# Patient Record
Sex: Female | Born: 1990 | Race: White | Hispanic: No | Marital: Single | Smoking: Current every day smoker
Health system: Southern US, Community
[De-identification: ages and names within clinical notes are randomized; demographics above are authoritative.]

## PROBLEM LIST (undated history)

## (undated) DIAGNOSIS — F909 Attention-deficit hyperactivity disorder, unspecified type: Secondary | ICD-10-CM

## (undated) DIAGNOSIS — F32A Depression, unspecified: Secondary | ICD-10-CM

## (undated) DIAGNOSIS — F329 Major depressive disorder, single episode, unspecified: Secondary | ICD-10-CM

## (undated) DIAGNOSIS — F419 Anxiety disorder, unspecified: Secondary | ICD-10-CM

## (undated) DIAGNOSIS — F410 Panic disorder [episodic paroxysmal anxiety] without agoraphobia: Secondary | ICD-10-CM

## (undated) DIAGNOSIS — G43909 Migraine, unspecified, not intractable, without status migrainosus: Secondary | ICD-10-CM

## (undated) HISTORY — DX: Depression, unspecified: F32.A

## (undated) HISTORY — PX: OTHER SURGICAL HISTORY: SHX169

## (undated) HISTORY — PX: KNEE ARTHROSCOPY: SHX127

## (undated) HISTORY — DX: Major depressive disorder, single episode, unspecified: F32.9

---
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
## 2010-01-12 ENCOUNTER — Ambulatory Visit (HOSPITAL_COMMUNITY): Payer: Self-pay | Admitting: Psychiatry

## 2010-02-18 ENCOUNTER — Ambulatory Visit: Payer: Self-pay | Admitting: Internal Medicine

## 2010-02-18 DIAGNOSIS — R5381 Other malaise: Secondary | ICD-10-CM

## 2010-02-18 DIAGNOSIS — J45990 Exercise induced bronchospasm: Secondary | ICD-10-CM

## 2010-02-18 DIAGNOSIS — R259 Unspecified abnormal involuntary movements: Secondary | ICD-10-CM | POA: Insufficient documentation

## 2010-02-18 DIAGNOSIS — R5383 Other fatigue: Secondary | ICD-10-CM

## 2010-02-18 LAB — CONVERTED CEMR LAB: Free T4: 1.33 ng/dL (ref 0.80–1.80)

## 2010-02-19 ENCOUNTER — Telehealth: Payer: Self-pay | Admitting: Internal Medicine

## 2010-05-22 ENCOUNTER — Ambulatory Visit: Payer: Self-pay | Admitting: Internal Medicine

## 2010-05-25 ENCOUNTER — Ambulatory Visit: Payer: Self-pay | Admitting: Internal Medicine

## 2010-06-09 ENCOUNTER — Ambulatory Visit: Payer: Self-pay | Admitting: Internal Medicine

## 2010-06-09 DIAGNOSIS — J309 Allergic rhinitis, unspecified: Secondary | ICD-10-CM | POA: Insufficient documentation

## 2010-10-27 ENCOUNTER — Ambulatory Visit: Payer: Self-pay | Admitting: Internal Medicine

## 2010-10-27 DIAGNOSIS — H9190 Unspecified hearing loss, unspecified ear: Secondary | ICD-10-CM | POA: Insufficient documentation

## 2010-10-27 DIAGNOSIS — G4700 Insomnia, unspecified: Secondary | ICD-10-CM

## 2010-12-09 ENCOUNTER — Ambulatory Visit: Payer: Self-pay | Admitting: Pulmonary Disease

## 2010-12-09 DIAGNOSIS — G2581 Restless legs syndrome: Secondary | ICD-10-CM | POA: Insufficient documentation

## 2010-12-09 DIAGNOSIS — G4721 Circadian rhythm sleep disorder, delayed sleep phase type: Secondary | ICD-10-CM

## 2011-01-15 ENCOUNTER — Ambulatory Visit (INDEPENDENT_AMBULATORY_CARE_PROVIDER_SITE_OTHER)
Admission: RE | Admit: 2011-01-15 | Discharge: 2011-01-15 | Payer: Managed Care, Other (non HMO) | Source: Home / Self Care | Attending: Internal Medicine | Admitting: Internal Medicine

## 2011-01-15 ENCOUNTER — Telehealth (INDEPENDENT_AMBULATORY_CARE_PROVIDER_SITE_OTHER): Payer: Self-pay | Admitting: *Deleted

## 2011-01-15 DIAGNOSIS — G4700 Insomnia, unspecified: Secondary | ICD-10-CM

## 2011-01-19 NOTE — Assessment & Plan Note (Signed)
Summary: poss sinus infection/dt   Vital Signs:  Patient profile:   20 year old female Weight:      116.75 pounds BMI:     25.36 O2 Sat:      100 % on Room air Temp:     98.1 degrees F oral Pulse rate:   76 / minute Pulse rhythm:   regular Resp:     16 per minute BP sitting:   102 / 70  (right arm) Cuff size:   regular  Vitals Entered By: Glendell Docker CMA (June 09, 2010 10:01 AM)  O2 Flow:  Room air CC: Rm 2- Sinus Problems Is Patient Diabetic? No   Primary Care Provider:  Dondra Spry DO  CC:  Rm 2- Sinus Problems.  History of Present Illness:  20 y/o white female c/o nasal congestion,  clear drainage and headaches.  no fever or chills symptoms ongoing x 1 week.    no relief with OTC cold preps   Allergies: No Known Drug Allergies  Past History:  Past Medical History: Depression- Currently in treatment with Dr  Lucianne Muss @ [facility]    (hospital admission-2009)  Exercise-induced asthma     Past Surgical History: Left Knee Arthroscopic 2008  History of lambdoid stenosis requiring surgery at 57 months of age  (27)     Family History: Family History of Alcoholism/Addiction  Family History High cholesterol - maternal grandmother Family History Hypertension- maternal grandmother       Social History: Occupation: working at [employer] Single       Physical Exam  General:  alert, well-developed, and well-nourished.   Ears:  R ear normal and L ear normal.   Nose:  nasal dischargemucosal pallor and mucosal edema.   Lungs:  normal respiratory effort and normal breath sounds.   Heart:  normal rate, regular rhythm, and no gallop.     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 2 sprays each nostril once daily  Discussed use of allergy medications and  environmental measures.  Patient advised to call office if symptoms persist or worsen.  Complete Medication List: 1)  Seasonique 0.15-0.03 &0.01 Mg Tabs (Levonorgest-eth estrad 91-day) .... Take 1 tablet by mouth once a day 2)  Xopenex Hfa 45 Mcg/act Aero (Levalbuterol tartrate) .... 2 puffs qid as needed 3)  Amitriptyline Hcl 10 Mg Tabs (Amitriptyline hcl) .... One by mouth at bedtime prn 4)  Prednisone 20 Mg Tabs (Prednisone) .... One by mouth two times a day x 3 days, 1/2 by mouth two times a day x 4 days, than 1/2 once daily x 4 days 5)  Fluticasone Propionate 50 Mcg/act Susp (Fluticasone propionate) .... 2 sprays each nostril once daily  Patient Instructions: 1)  Call our office if your symptoms do not  improve or gets worse. Prescriptions: FLUTICASONE PROPIONATE 50 MCG/ACT SUSP (FLUTICASONE PROPIONATE) 2 sprays each nostril once daily  #1 x 2   Entered and Authorized by:   D. Thomos Lemons DO   Signed by:   D. Thomos Lemons DO on 06/09/2010   Method used:   Electronically to        [id number] (retail)       [address] [place], [place]  [address]       Ph: [phone]       Fax: [phone]   RxID:   [id number] PREDNISONE 20 MG TABS (PREDNISONE) one by mouth two times a day x 3 days, 1/2 by mouth two times a day x 4 days, than 1/2 once daily x 4 days  #10 x 0   Entered and Authorized by:   D. Thomos Lemons DO   Signed by:   D. Thomos Lemons DO on 06/09/2010   Method used:   Electronically to        [id number] (retail)       [address] [place], [place]  [address]       Ph: [phone]       Fax: [phone]   RxID:   [id number]

## 2011-01-19 NOTE — Progress Notes (Signed)
  Phone Note Outgoing Call   Summary of Call: call pt - thyroid studies normal.  mono spot is negative Initial call taken by: D. Thomos Lemons DO,  February 19, 2010 5:52 PM  Follow-up for Phone Call        Called pt. and informed patient that Thyroid studies are normal and mono spot is negative Follow-up by: Michaelle Copas,  February 20, 2010 8:46 AM

## 2011-01-19 NOTE — Assessment & Plan Note (Signed)
Summary: insomnia/dt   Vital Signs:  Patient profile:   20 year old female Height:      57 inches Weight:      114.25 pounds BMI:     24.81 O2 Sat:      98 % on Room air Temp:     97.9 degrees F oral Pulse rate:   72 / minute Pulse rhythm:   regular Resp:     18 per minute BP sitting:   106 / 60  (right arm) Cuff size:   regular  Vitals Entered By: Glendell Docker CMA (October 27, 2010 10:22 AM)  O2 Flow:  Room air CC: Insomnia Is Patient Diabetic? No Pain Assessment Patient in pain? no      Comments due to work hours, she gets home late, and the Amitriptyline has he feeling groggy and she has trouble waking up to sound   Primary Care Provider:  D. Thomos Lemons DO  CC:  Insomnia.  History of Present Illness: 20 y/o white female for f/u re:  insomnia still struggling she has tried relaxation techniques works at night (home at 10 pm) -  sun thru thursday she has trouble getting to sleep also finds she has eat late amitriptyline causes grogginess, drowsiness in AM  no other prescription meds tried in the past OTC melatonin - not helpful   Allergies (verified): No Known Drug Allergies  Past History:  Past Medical History: Depression- Currently in treatment with Dr  Lucianne Muss @ [facility]    (hospital admission-2009)  Exercise-induced asthma      Family History: Family History of Alcoholism/Addiction  Family History High cholesterol - maternal grandmother Family History Hypertension- maternal grandmother        Social History: Occupation: working at [employer] Single        Physical Exam  General:  alert, well-developed, and well-nourished.   Ears:  R ear normal and L ear normal.  able to discern finger rub bilaterally Lungs:  normal respiratory effort and normal breath sounds.   Heart:  normal rate, regular rhythm, and no gallop.   Extremities:  No lower extremity edema    Impression & Recommendations:  Problem #  1:  INSOMNIA, CHRONIC (ICD-307.42) poor response to TCA and sleep hygiene changes use ambien as needed  Problem # 2:  HEARING LOSS (ICD-389.9)  pt describes transient hearing loss when she wakes up in AM.   she is able discern finger rub refer to ENT for further hearing evaluation  Orders: ENT Referral (ENT)  Complete Medication List: 1)  Xopenex Hfa 45 Mcg/act Aero (Levalbuterol tartrate) .... 2 puffs qid as needed 2)  Nuvaring 0.12-0.015 Mg/24hr Ring (Etonogestrel-ethinyl estradiol) .... Use as directed 3)  Zolpidem Tartrate 5 Mg Tabs (Zolpidem tartrate) .... 1/2 to one tab by mouth at bedtime as needed  Patient Instructions: 1)  Please schedule a follow-up appointment in 3 months. Prescriptions: ZOLPIDEM TARTRATE 5 MG TABS (ZOLPIDEM TARTRATE) 1/2 to one tab by mouth at bedtime as needed  #30 x 3   Entered and Authorized by:   D. Thomos Lemons DO   Signed by:   D. Thomos Lemons DO on 10/27/2010   Method used:   Print then Give to Patient   RxID:   [id number]    Orders Added: 1)  ENT Referral [ENT] 2)  Est. Patient Level III [[address]782]   Immunization History:  Influenza Immunization History:    Influenza:  declined (10/27/2010)  Contraindications/Deferment of Procedures/Staging:    Test/Procedure: FLU VAX    Reason for deferment: patient declined   Immunization History:  Influenza Immunization History:    Influenza:  Declined (10/27/2010)   Current Allergies (reviewed today): No known allergies

## 2011-01-19 NOTE — Assessment & Plan Note (Signed)
Summary: menagitis shot and read tb test/mhf  Nurse Visit   Primary Care Provider:  Dondra Spry DO   History of Present Illness: The patient presented after 48 hours to check the injection site for negative or positive reation. Injection site examination : no firm bump forms at the test site. Slightly reddish appearance and diameter was smaller than  5mm. Assessment  & Plan : negative TB skin test . Patient was counseled to call if any irritation of  site.  Glendell Docker CMA  May 25, 2010 12:05 PM     Allergies: No Known Drug Allergies  PPD Results    Date of reading: 05/25/2010    Results: < 5mm    Interpretation: negative

## 2011-01-19 NOTE — Assessment & Plan Note (Signed)
Summary: insomnia/dt   Vital Signs:  Patient profile:   20 year old female Height:      57 inches Weight:      116 pounds BMI:     25.19 O2 Sat:      98 % on Room air Temp:     97.9 degrees F oral Pulse rate:   78 / minute Pulse rhythm:   regular Resp:     16 per minute BP sitting:   100 / 60  (left arm) Cuff size:   regular  Vitals Entered By: Glendell Docker CMA (May 22, 2010 11:02 AM)  O2 Flow:  Room air CC: Rm 3- Insomnia   Primary Care Provider:  Dondra Spry DO  CC:  Rm 3- Insomnia.  History of Present Illness: 20 y/o white female with hx of depression c/o insomnia c/o trouble sleeping, falling and staying asleep,  sleep is interrupted about 2 times a night, with difficulty falling back asleep, ongoing for the past year with an increase in the past few months she works at [employer].  she notes  job stress, difficulty adjusting to changes, bothered by wt. she stopped taking lexapro.  it made her feel funny she does not have good relationship with current psychiatrist she denies suicidal ideation  no significant caffeine use   Allergies (verified): No Known Drug Allergies  Past History:  Past Medical History: Depression- Currently in treatment with Dr  Lucianne Muss @ [facility]    (hospital admission-2009)  Exercise-induced asthma   Past Surgical History: Left Knee Arthroscopic 2008 History of lambdoid stenosis requiring surgery at 18 months of age  (74)    Family History: Family History of Alcoholism/Addiction  Family History High cholesterol - maternal grandmother Family History Hypertension- maternal grandmother     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   Lungs:  normal respiratory effort and normal breath sounds.   Heart:  normal rate, regular rhythm, and no gallop.   Neurologic:  cranial nerves II-XII intact and gait normal.     Psych:  normally interactive, good eye contact, not anxious appearing, and not depressed appearing.     Impression & Recommendations:  Problem # 1:  DEPRESSION (ICD-311) pt with chroninc insomnia.   sleep issues may be related to depression.  requests referral to new psychiatrist.  holf off on lexapro for now.  trial of low dose amitriptyline at bedtime.   pt advised to avoid using computer before bedtime. Patient advised to call office if symptoms persist or worsen.  The following medications were removed from the medication list:    Lexapro 20 Mg Tabs (Escitalopram oxalate) .Marland Kitchen... Take 1 tablet by mouth once a day Her updated medication list for this problem includes:    Amitriptyline Hcl 10 Mg Tabs (Amitriptyline hcl) ..... One by mouth at bedtime prn  Orders: Psychiatric Referral (Psych)  Complete Medication List: 1)  Seasonique 0.15-0.03 &0.01 Mg Tabs (Levonorgest-eth estrad 91-day) .... Take 1 tablet by mouth once a day 2)  Xopenex Hfa 45 Mcg/act Aero (Levalbuterol tartrate) .... 2 puffs qid as needed 3)  Amitriptyline Hcl 10 Mg Tabs (Amitriptyline hcl) .... One by mouth at bedtime prn  Other Orders: TB Skin Test [id number]) Admin 1st Vaccine (21308)  Patient Instructions: 1)  Please schedule a follow-up appointment in 3 months. 2)  Call our office within 1 month re:  sleep issue  Prescriptions: AMITRIPTYLINE HCL 10 MG TABS (AMITRIPTYLINE HCL) one by mouth at bedtime prn  #30 x 3   Entered and Authorized by:   D. Thomos Lemons DO   Signed by:   D. Thomos Lemons DO on 05/22/2010   Method used:   Electronically to        [id number] (retail)       [address] [place], [place]  [address]       Ph: [phone]       Fax: [phone]   RxID:   [id number]   Current Allergies (reviewed today): No known allergies       Immunizations Administered:  PPD Skin Test:    Vaccine Type: PPD    Site: left forearm    Mfr: Sanofi  Pasteur    Dose: 0.1 ml    Route: ID    Given by: Glendell Docker CMA    Exp. Date: 10/02/2011    Lot #: [id number]

## 2011-01-19 NOTE — Miscellaneous (Signed)
Summary: Vaccine Record/[facility]  Vaccine Record/[facility]   Imported By: Lanelle Bal 06/02/2010 11:40:27  _____________________________________________________________________  External Attachment:    Type:   Image     Comment:   External Document

## 2011-01-19 NOTE — Letter (Signed)
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 [address]   [place], [place] [address]   Phone: [phone]  Fax: [phone]      May 25, 2010    Employee:  Megan Sampson      To Whom It May Concern:   For Medical reasons, please excuse the above named employee from work for the following dates:  Start:   05/24/2010  End:   05/24/2010  If you need additional information, please feel free to contact our office.         Sincerely,    Glendell Docker CMA Dr Thomos Lemons

## 2011-01-19 NOTE — Assessment & Plan Note (Signed)
Summary: tonsils are swollen, Thyroid test- jr   Vital Signs:  Patient profile:   20 year old female Weight:      114 pounds BMI:     24.76 O2 Sat:      100 % on Room air Temp:     97.8 degrees F oral Pulse rate:   80 / minute Pulse rhythm:   regular Resp:     22 per minute BP sitting:   110 / 60  (left arm) Cuff size:   regular  Vitals Entered By: Glendell Docker CMA (February 18, 2010 11:15 AM)  O2 Flow:  Room air CC: RM 3-Multiple concerns, URI symptoms Is Patient Diabetic? No Comments c/o being shaky for te past 2-3 months and that has increased, evaluated by psychologist who advised her to have her thyroid checked, and she is having swelling on the left side of tonsils for the past week   Primary Care Provider:  Dondra Spry DO  CC:  RM 3-Multiple concerns and URI symptoms.  History of Present Illness:  URI Symptoms      This is an 20 year old woman who presents with URI symptoms.  The patient reports sore throat.  The patient denies fever.  The patient also reports fatigue.  left side of tonsil feels swollen  She also notes feeling shakey.  her psych concerned about hyperthyroidism  Preventive Screening-Counseling & Management  Alcohol-Tobacco     Smoking Status: never  Allergies (verified): No Known Drug Allergies  Past History:  Past Medical History: Depression- Currently in treatment with Dr  Lucianne Muss @ [facility]    (hospital admission-2009) Exercise-induced asthma   Past Surgical History: Left Knee Arthroscopic 2008 History of lambdoid stenosis requiring surgery at 67 months of age  (29)   Family History: Family History of Alcoholism/Addiction  Family History High cholesterol - maternal grandmother Family History Hypertension- maternal grandmother   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   Ears:  R ear normal and L ear  normal.   Mouth:  pharyngeal erythema.  mild enlargement left tonsil Neck:  supple, no masses, no thyromegaly, and no thyroid nodules or tenderness.   Lungs:  normal respiratory effort and normal breath sounds.   Heart:  normal rate, regular rhythm, and no gallop.   Abdomen:  soft and non-tender.  mild pelvic fullness Extremities:  No lower extremity edema  Neurologic:  no tremor   Impression & Recommendations:  Problem # 1:  TREMOR (ICD-781.0) I doubt tremor related to hyperthyroidism.  I suspect symptoms worse with b agonist.  change to xopenex.  Orders: T-TSH [phone]) T-T4, Free [phone])  Problem # 2:  FATIGUE (ICD-780.79) Pt with URI symptoms and fatigue.  check monospot  Orders: T- * Misc. Laboratory test [phone])  Problem # 3:  EXERCISE INDUCED ASTHMA (ICD-493.81) change albuteroal to xopenex due complaints of tremor / shakiness Her updated medication list for this problem includes:    Xopenex Hfa 45 Mcg/act Aero (Levalbuterol tartrate) .Marland Kitchen... 2 puffs qid as needed  Problem # 4:  URI (ICD-465.9)  Instructed on symptomatic treatment. Call if symptoms persist or worsen.   Complete Medication List: 1)  Lexapro 20 Mg Tabs (Escitalopram oxalate) .... Take 1 tablet by mouth once a day 2)  Seasonique 0.15-0.03 &0.01 Mg Tabs (Levonorgest-eth estrad 91-day) .... Take 1 tablet by mouth once a day 3)  Xopenex Hfa  45 Mcg/act Aero (Levalbuterol tartrate) .... 2 puffs qid as needed  Patient Instructions: 1)  Gargle with warm salt water. 2)  We will contact you re:  test results 3)  Call our office if your sore throat does not  improve or gets worse. Prescriptions: XOPENEX HFA 45 MCG/ACT AERO (LEVALBUTEROL TARTRATE) 2 puffs qid as needed  #1 x 3   Entered and Authorized by:   D. Thomos Lemons DO   Signed by:   D. Thomos Lemons DO on 02/18/2010   Method used:   Electronically to        [id number] (retail)       [address] [place], [place]  [address]       Ph: [phone]       Fax: [phone]   RxID:   [id number]   Current Allergies (reviewed today): No known allergies

## 2011-01-19 NOTE — Miscellaneous (Signed)
Summary: immunizations  Clinical Lists Changes  Observations: Added new observation of MENINGOC VAX: Historical (09/07/2006 14:15) Added new observation of MMR #2: Historical (08/28/1996 12:17) Added new observation of HEMINFB#4: Historical (08/28/1996 12:17) Added new observation of DPT #5: Historical (08/28/1996 12:17) Added new observation of OPV #3: Historical (03/26/1993 12:17) Added new observation of DPT #4: Historical (03/23/1993 12:17) Added new observation of OPV #2: Historical (11/10/1992 12:17) Added new observation of DPT #2: Historical (11/10/1992 12:17) Added new observation of MMR #1: Historical (09/08/1992 12:17) Added new observation of HEMINFB#3: Historical (09/08/1992 12:17) Added new observation of HEPBVAX#3: Historical (06/05/1992 12:17) Added new observation of DPT #3: Historical (03/19/1992 12:17) Added new observation of HEPBVAX#2: Historical (12/05/1991 12:17) Added new observation of HEMINFB#2: Historical (11/11/1991 12:17) Added new observation of OPV #1: Historical (10/25/1991 12:17) Added new observation of HEMINFB#1: Historical (10/25/1991 12:17) Added new observation of DPT #1: Historical (10/25/1991 12:17) Added new observation of HEPBVAX#1: Historical (09/27/1991 12:17)      Immunization History:  Hepatitis B Immunization History:    Hepatitis B # 1:  historical (09/27/1991)    Hepatitis B # 2:  historical (12/05/1991)    Hepatitis B # 3:  historical (06/05/1992)  DPT Immunization History:    DPT # 1:  historical (10/25/1991)    DPT # 2:  historical (11/10/1992)    DPT # 3:  historical (03/19/1992)    DPT # 4:  historical (03/23/1993)    DPT # 5:  historical (08/28/1996)  HIB Immunization History:    HIB # 1:  historical (10/25/1991)    HIB # 2:  historical (11/11/1991)    HIB # 3:  historical (09/08/1992)    HIB # 4:  historical (08/28/1996)  Polio Immunization History:    Polio # 1:  historical (10/25/1991)    Polio # 2:   historical (11/10/1992)    Polio # 3:  historical (03/26/1993)  MMR Immunization History:    MMR # 1:  historical (09/08/1992)    MMR # 2:  historical (08/28/1996)  Meningococcal Immunization History:    Meningococcal:  historical (09/07/2006)

## 2011-01-21 NOTE — Assessment & Plan Note (Signed)
Summary: self referral for sleep issues   Copy to:  Self referral Primary Provider/Referring Provider:  Dondra Spry DO  CC:  pt c/o not waking up to an alarm and pt states she can't hear any noise when waking her up. Marland Kitchen  History of Present Illness: The pt is a 20y/o female who comes in today as a self referral for evaluation of sleeping issues.  She has had issues with sleep onset and maintenance for years, but feels they may be getting worse.  When she was in high school, she would typically go to bed 2-3am.  Now she tries to go to bed btw 10-1130pm, and is unable to initiate sleep for hour or more unless she takes an [place] (then can fall asleep in ).  She will awaken one to two times a night, but typically gets back to sleep easily whether she takes [place] or not.  She gets up to go school at 7am, but any other time doesn't get up until 11am.  She typically does not feel rested upon arising.  She will drink one cup of coffee a day, but denies drinking caffeinated sodas except on rare occasions.  She will take naps during the day when able.  She will work after school from [employer] till 10pm.  She does have sleep hygiene issues in that she will read in bed, and is on the computer leading up to her bedtime.  She has a tv in her bedroom, but rarely watches it.  She does have chronic back pain from an injury as a child, and this can interfere with sleep onset.  She does admit to some kicking during the night, and has RLS symptoms in the evening that do improve with leg movements.  Her boyfriend has heard light snoring when she sleeps on her back, but has never seen breathing issues during the night.  Her weight has increased 20 pounds over the last two years.    Current Medications (verified): 1)  Xopenex Hfa 45 Mcg/act Aero (Levalbuterol Tartrate) .... 2 Puffs Qid As Needed 2)  Nuvaring 0.12-0.015 Mg/24hr Ring (Etonogestrel-Ethinyl Estradiol) .... Use As Directed 3)  Zolpidem Tartrate 5 Mg Tabs  (Zolpidem Tartrate) .... 1/2 To One Tab By Mouth At Bedtime As Needed  Allergies (verified): No Known Drug Allergies  Past History:  Past Medical History: Reviewed history from 10/27/2010 and no changes required. Depression- Currently in treatment with Dr  Lucianne Muss @ [facility]    (hospital admission-2009)  Exercise-induced asthma      Past Surgical History: Left Knee Arthroscopic 2008  History of lumbar stenosis requiring surgery at 34 months of age  (41)     Family History: Reviewed history from 10/27/2010 and no changes required. Family History of Alcoholism/Addiction  Family History High cholesterol - maternal grandmother Family History Hypertension- maternal grandmother  asthma: mother allergies: mother        Social History: Reviewed history from 10/27/2010 and no changes required. Occupation: working at [employer] Single lives with parents never smoker  Review of Systems       The patient complains of shortness of breath with activity, weight change, and headaches.  The patient denies shortness of breath at rest, productive cough, non-productive cough, coughing up blood, chest pain, irregular heartbeats, acid heartburn, indigestion, loss of appetite, abdominal pain, difficulty swallowing, sore throat, tooth/dental problems, nasal congestion/difficulty breathing through nose, sneezing, itching, ear ache, anxiety, depression, hand/feet swelling, joint stiffness or pain, rash,  change in color of mucus, and fever.    Vital Signs:  Patient profile:   20 year old female Height:      57 inches Weight:      116 pounds BMI:     25.19 O2 Sat:      98 % on Room air Temp:     98.5 degrees F oral Pulse rate:   73 / minute BP sitting:   102 / 64  (left arm) Cuff size:   regular  Vitals Entered By: Carver Fila (December 09, 2010 10:27 AM)  O2 Flow:  Room air CC: pt c/o not waking up to an alarm, pt states she can't hear any noise when waking  her up.  Comments meds and allergies updated Phone number updated Carver Fila  December 09, 2010 10:27 AM    Physical Exam  General:  wd female in nad Eyes:  PERRLA and EOMI.   Nose:  patent without discharge Mouth:  no exudates or lesions seen no obvious obstruction Neck:  no jvd, tmg, LN Lungs:  clear to auscultation Heart:  rrr, no mrg Abdomen:  soft and nontender, bs+ Extremities:  no edema or cyanosis  pulses intact distally Neurologic:  alert and oriented, moves all 4. 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  For years she would go to bed 2-3am, and sleep late if she did not have to attend school, and now is being restricted by her daily committments.  She has some sleep hygiene issues to work on, but would also like her to establish a consistent bed and awakening time EVERYDAY.  I have also discussed with her behavioral therapies such as ritualistic behaviors and stimulus control to help with sleep onset as well.  I have asked her to stop taking ambien, since this is rarely a solution to a longterm problem.   She has some symptoms that are suggestive of RLS, but no convincing history for sleep disordered breathing.  Problem # 2:  RESTLESS LEGS SYNDROME (ICD-333.94)  the pt gives a h/o kicking during the night, and does have an uncomfortable feeling in her legs in the evening that improves with movement.  Will give her a trial of a dopamine agonist to see if her symptoms and sleep improve.  Medications Added to Medication List This Visit: 1)  Requip 0.5 Mg Tabs (Ropinirole hcl) .... At bedtime  Other Orders: New Patient Level V ([address])  Patient Instructions: 1)  stop ambien 2)  trial of requip 0.5mg  at bedtime and after dinner for possible restless legs. 3)  bedtime  12am, up at 7am everyday! 4)  do not stay in bed if you cannot initiate sleep within . 5)  no computer work after 10pm, but can watch tv 6)  no tv or reading in bedroom 7)  no napping during day 8)  please call me in 2 weeks with feedback on how things are going.  Prescriptions: REQUIP 0.5 MG  TABS (ROPINIROLE HCL) At bedtime  #30 x 1   Entered and Authorized by:   Barbaraann Share MD   Signed by:   Barbaraann Share MD on 12/09/2010   Method used:   Print then Give to Patient   RxID:   [id number]

## 2011-01-21 NOTE — Progress Notes (Signed)
Summary: requip / side effects  Phone Note Call from Patient   Caller: Patient Call For: clance Summary of Call: pt states that she began taking requip on jan 3. has difficulty sleeping - also anxiety attacks. she was told to call kc after taking this a few weeks and update him on this. pt # [id number]. ([facility] uses [facility]) Initial call taken by: Tivis Ringer, CNA,  January 15, 2011 8:51 AM  Follow-up for Phone Call        Pt reports that Requip is not really helping her sleep issues.  She is still waking up in middle of night and hasn't had a good night of sleep in the past week.  Also pt has had h/o anxiety attacks but she had been off of Lexapro for a year because this was better but pt had anxiety attack last night and thinks that this may be due to Requip.  Please advise. Abigail Miyamoto RN  January 15, 2011 9:11 AM   Additional Follow-up for Phone Call Additional follow up Details #1::        If she feels the requip is causing problems, then stop it.  She needs to work on the various behavioral treatments we discussed, and to improve her sleep hygiene as we discussed....better sleep habits. Additional Follow-up by: Barbaraann Share MD,  January 15, 2011 1:39 PM    Additional Follow-up for Phone Call Additional follow up Details #2::    Spoke with pt.  I advised her of the above recs per [facility].  Pt verbalized understanding.  She states that even when she has tried the behavioral things that [facility] discussed with her and still can not fall asleep at all.  She states that her mother is very concerned.  She states that she is needing some type of med to help her sleep, not necc ambien, but "something".  Pls advise thanks Follow-up by: Vernie Murders,  January 15, 2011 1:54 PM  Additional Follow-up for Phone Call Additional follow up Details #3:: Details for Additional Follow-up Action Taken: I usually do not use sleeping medications for this type of issue, because they build tolerance and are  addicting.  I think she needs to see a behavioral specialist for something called CBT (cognitive behavioral therapy).  Her primary md can refer her to a behavioral therapist to work on this.    LMTCB x 1 Vernie Murders  January 15, 2011 3:14 PM  Spoke with pt and notified of the above recs.  She states that she actually just left her PCP and they have already started the referal proccess to behavioral therapist. Vernie Murders  January 15, 2011 4:04 PM  Additional Follow-up by: Barbaraann Share MD,  January 15, 2011 3:09 PM

## 2011-02-04 NOTE — Assessment & Plan Note (Signed)
Summary: sleep issues/mhf   Vital Signs:  Patient profile:   20 year old female Height:      57 inches Weight:      117.25 pounds BMI:     25.46 O2 Sat:      100 % on Room air Temp:     98.2 degrees F oral Pulse rate:   77 / minute Resp:     16 per minute BP sitting:   90 / 50  (right arm) Cuff size:   regular  Vitals Entered By: Glendell Docker CMA (January 15, 2011 3:13 PM)  O2 Flow:  Room air CC: Sleep disturbance Is Patient Diabetic? No Pain Assessment Patient in pain? no        Primary Care Provider:  Dondra Spry DO  CC:  Sleep disturbance.  History of Present Illness: 20 y/o female for f/u re:  insomnia pt eval by sleep specialist  tried requip x 2 weeks started to exp nausea and flu like symptoms  it would help with sleep onset but woke up feeling groggy  slept all day.  later that day felt like she had anxiety attack    Preventive Screening-Counseling & Management  Alcohol-Tobacco     Smoking Status: never  Allergies (verified): 1)  ! Requip  Past History:  Past Medical History: Hx of adjustment d/o with depression    (hospital admission-2009)  Exercise-induced asthma       Past Surgical History: Left Knee Arthroscopic 2008  History of lumbar stenosis requiring surgery at 44 months of age  (67)      Family History: Family History of Alcoholism/Addiction  Family History High cholesterol - maternal grandmother Family History Hypertension- maternal grandmother  asthma: mother allergies: mother         Social History: Occupation: working at [employer] Single lives with parents never smoker    Physical Exam  General:  alert, well-developed, and well-nourished.   Lungs:  normal respiratory effort and normal breath sounds.   Heart:  normal rate, regular rhythm, and no gallop.   Psych:  normally interactive, good eye contact, not anxious appearing, and not depressed appearing.     Impression &  Recommendations:  Problem # 1:  INSOMNIA, CHRONIC (ICD-307.42) pt unable to tolerate requip she would like to avoid use of sedatives trial of CBT  regular exercise also encouraged  trial of rozerem  Orders: Misc. Referral (Misc. Ref)  Complete Medication List: 1)  Xopenex Hfa 45 Mcg/act Aero (Levalbuterol tartrate) .... 2 puffs qid as needed 2)  Nuvaring 0.12-0.015 Mg/24hr Ring (Etonogestrel-ethinyl estradiol) .... Use as directed 3)  Rozerem 8 Mg Tabs (Ramelteon) .... 1/2 tab by mouth at bedtime  Patient Instructions: 1)  Please schedule a follow-up appointment in 2 months. Prescriptions: ROZEREM 8 MG TABS (RAMELTEON) 1/2 tab by mouth at bedtime  #30 x 1   Entered and Authorized by:   D. Thomos Lemons DO   Signed by:   D. Thomos Lemons DO on 01/15/2011   Method used:   Electronically to        [id number] (retail)       [address] [place], [place]  [address]       Ph: [phone]       Fax: [phone]   RxID:   [id number]    Orders Added: 1)  Misc. Referral [Misc. Ref] 2)  Est. Patient Level III [308[address]]    Current Allergies (reviewed today): ! REQUIP

## 2011-02-22 ENCOUNTER — Ambulatory Visit (INDEPENDENT_AMBULATORY_CARE_PROVIDER_SITE_OTHER): Payer: Managed Care, Other (non HMO) | Admitting: Internal Medicine

## 2011-02-22 ENCOUNTER — Encounter: Payer: Self-pay | Admitting: Internal Medicine

## 2011-02-22 DIAGNOSIS — R5381 Other malaise: Secondary | ICD-10-CM

## 2011-02-22 DIAGNOSIS — G4700 Insomnia, unspecified: Secondary | ICD-10-CM

## 2011-02-22 DIAGNOSIS — J069 Acute upper respiratory infection, unspecified: Secondary | ICD-10-CM | POA: Insufficient documentation

## 2011-02-22 DIAGNOSIS — N898 Other specified noninflammatory disorders of vagina: Secondary | ICD-10-CM

## 2011-02-22 LAB — CONVERTED CEMR LAB
Bilirubin Urine: NEGATIVE
Ketones, urine, test strip: NEGATIVE
Nitrite: NEGATIVE
Specific Gravity, Urine: >=1.03
pH: 5

## 2011-02-24 ENCOUNTER — Encounter: Payer: Self-pay | Admitting: Internal Medicine

## 2011-03-02 NOTE — Letter (Signed)
Summary: Work Dietitian at [employer]. [address]   [place], [place] [address]   Phone: [phone]  Fax: [phone]      Today's Date: February 22, 2011     Name of Patient: Megan Sampson    The above named patient had a medical visit today. Please take this into consideration when reviewing the time away from work.  Special Instructions:  [ X ] None  [  ] To be off the remainder of today, returning to the normal work / school schedule tomorrow.  [  ] To be off until the next scheduled appointment on ______________________.  [  ] Other ________________________________________________________________ ________________________________________________________________________      Sincerely yours,   Glendell Docker CMA Dr. Thomos Lemons

## 2011-03-08 ENCOUNTER — Other Ambulatory Visit (HOSPITAL_COMMUNITY)
Admission: RE | Admit: 2011-03-08 | Discharge: 2011-03-08 | Disposition: A | Discharge status: Home / Self Care | Payer: Managed Care, Other (non HMO) | Source: Ambulatory Visit | Attending: Internal Medicine | Admitting: Internal Medicine

## 2011-03-08 ENCOUNTER — Other Ambulatory Visit: Payer: Self-pay | Admitting: Family

## 2011-03-08 ENCOUNTER — Other Ambulatory Visit: Payer: Self-pay | Admitting: Internal Medicine

## 2011-03-08 ENCOUNTER — Encounter (INDEPENDENT_AMBULATORY_CARE_PROVIDER_SITE_OTHER): Payer: Managed Care, Other (non HMO) | Admitting: Family

## 2011-03-08 ENCOUNTER — Encounter: Payer: Self-pay | Admitting: Family

## 2011-03-08 DIAGNOSIS — Z0000 Encounter for general adult medical examination without abnormal findings: Secondary | ICD-10-CM

## 2011-03-08 DIAGNOSIS — N898 Other specified noninflammatory disorders of vagina: Secondary | ICD-10-CM

## 2011-03-08 DIAGNOSIS — Z01419 Encounter for gynecological examination (general) (routine) without abnormal findings: Secondary | ICD-10-CM | POA: Insufficient documentation

## 2011-03-08 LAB — HM PAP SMEAR

## 2011-03-09 ENCOUNTER — Telehealth: Payer: Self-pay | Admitting: Family

## 2011-03-09 LAB — CONVERTED CEMR LAB: Trichomonal Vaginitis: NEGATIVE

## 2011-03-10 LAB — GC/CHLAMYDIA PROBE AMP, GENITAL: GC Probe Amp, Genital: NEGATIVE

## 2011-03-11 ENCOUNTER — Telehealth: Payer: Self-pay | Admitting: Family

## 2011-03-11 NOTE — Telephone Encounter (Signed)
Pt called stating that [facility] called in a rx (gel for bacterial infection)? [facility] but, pt is stating that the pharmacy never received. Please asisst.

## 2011-03-11 NOTE — Telephone Encounter (Signed)
Per pharmacy, they did not receive Metrogel rx from 03/09/11. Called rx in to [place] at [facility] per 3/20 phone note. Left detailed message on pt's phone that rx has been completed and to call if she has additional questions.

## 2011-03-12 ENCOUNTER — Encounter: Payer: Self-pay | Admitting: Family

## 2011-03-17 ENCOUNTER — Telehealth: Payer: Self-pay | Admitting: Internal Medicine

## 2011-03-17 MED ORDER — CLINDAMYCIN HCL 300 MG PO CAPS: 300.0000 mg | ORAL_CAPSULE | Freq: Two times a day (BID) | ORAL | Status: AC

## 2011-03-17 NOTE — Telephone Encounter (Signed)
Pt states she used Metro gel 2 days and experienced vaginal burning, stinging and itching. Did not use gel today and has not had these symptoms. She would like an alternative in a pill form called to [facility] on [address]. Pt's grandmother has taken Clindamycin tablets for this and pt is wanting to know if she could be prescribed the same. Please advise.

## 2011-03-17 NOTE — Telephone Encounter (Signed)
Pt called in stating that the gel Melissa has prescribed has not worked and pt stated that she had a reaction to med. Pt would like melissa to call her in something else.

## 2011-03-17 NOTE — Telephone Encounter (Signed)
Spoke with patient.  Instructed pt to d/c metrogel. Start clindamycin.  Reviewed pap smear and gc/chlamydia results- neg.

## 2011-03-18 NOTE — Assessment & Plan Note (Signed)
Summary: stomach ache and headache/ss   Vital Signs:  Patient profile:   20 year old female Height:      57 inches Weight:      114.50 pounds BMI:     24.87 O2 Sat:      100 % on Room air Temp:     98.5 degrees F oral Pulse rate:   85 / minute Resp:     18 per minute BP sitting:   110 / 70  (right arm) Cuff size:   regular  Vitals Entered By: Glendell Docker CMA (February 22, 2011 4:25 PM)  O2 Flow:  Room air  CC: Does not feel well Is Patient Diabetic? No Pain Assessment Patient in pain? no      Comments c/o headache, sneezing, stomach discomfort, non productive cough for the past 3 days , rozerm is working well   Primary Care Provider:  D. Thomos Lemons DO  CC:  Does not feel well.  History of Present Illness: 20 y/o female c/o woking up feeling "awful" fell asleep last night 9 PM had terrible headache in AM did not take anything no fevers but feels cold  headache  bitemporal - pressure sensation  no vomiting some loose stools  she admits to Increase urinary frequency and odor to her urine  chronic insomnia-significantly improved since using one half tablet of Rozerem 8 mg Denies side effects  Allergies: 1)  ! Requip  Past History:  Past Medical History: Hx of adjustment d/o with depression    (hospital admission-2009)  Exercise-induced asthma        Past Surgical History: Left Knee Arthroscopic 2008  History of lumbar stenosis requiring surgery at 89 months of age  (1992)       Review of Systems       patient reports she has some vaginal discharge.   she reports she has always had "more that usual" vaginal discharge prev seen by GYN - PAP not done.   told to wait until age 58 she is sexually active.  she is one BCP and also used condoms   Physical Exam  General:  alert, well-developed, and well-nourished.   Ears:  R ear normal and L ear normal.   Mouth:  pharyngeal erythema.   Neck:  No deformities, masses, or tenderness noted. Lungs:  normal  respiratory effort, normal breath sounds, no crackles, and no wheezes.   Heart:  normal rate, regular rhythm, no murmur, and no gallop.   Abdomen:  soft.  mild supra pubic tenderness, no pelvic tenderness, no masses Neurologic:  cranial nerves II-XII intact and gait normal.     Impression & Recommendations:  Problem # 1:  VIRAL URI (ICD-465.9) I suspect her headache, loose stools and generalized achiness from viral infection Recommended conservative measures-increase fluids, ibuprofen as needed Patient advised to call office if symptoms persist or worsen.  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 2 puffs qid as needed 2)  Nuvaring 0.12-0.015 Mg/24hr Ring (Etonogestrel-ethinyl estradiol) .... Use as directed 3)  Rozerem 8 Mg Tabs (Ramelteon) .... 1/2 tab by mouth at bedtime 4)  Cefuroxime Axetil 250 Mg Tabs (Cefuroxime axetil) .... One by mouth two times  a day  Other Orders: T-Culture, Urine ([id number]) Specimen Handling ([address]) Urine Pregnancy Test  (91478)  Patient Instructions: 1)  Push fluids 2)  Take ibuprofen 400 mg two times a day as needed (take with food and drink plenty of water) 3)  Call our office if your symptoms do not  improve or gets worse. Prescriptions: CEFUROXIME AXETIL 250 MG TABS (CEFUROXIME AXETIL) one by mouth two times a day  #14 x 0   Entered and Authorized by:   D. Thomos Lemons DO   Signed by:   D. Thomos Lemons DO on 02/22/2011   Method used:   Electronically to        [id number] (retail)       [address] [place], [place]  [address]       Ph: [phone]       Fax: [phone]   RxID:    [id number] ROZEREM 8 MG TABS (RAMELTEON) 1/2 tab by mouth at bedtime  #30 x 2   Entered and Authorized by:   D. Thomos Lemons DO   Signed by:   D. Thomos Lemons DO on 02/22/2011   Method used:   Electronically to        [facility] (retail)       [address] [place], [place]  [address]       Ph: [phone]       Fax: [phone]   RxID:   [id number]    Orders Added: 1)  T-Culture, Urine [[id number]] 2)  Specimen Handling [[address]] 3)  Urine Pregnancy Test  [81025] 4)  Est. Patient Level III [70623]    Current Allergies (reviewed today): ! REQUIP  Laboratory Results   Urine Tests    Routine Urinalysis   Color: straw Appearance: Cloudy Glucose: negative   (Normal Range: Negative) Bilirubin: negative   (Normal Range: Negative) Ketone: negative   (Normal Range: Negative) Spec. Gravity: >=1.030   (Normal Range: 1.003-1.035) Blood: trace-lysed   (Normal Range: Negative) pH: 5.0   (Normal Range: 5.0-8.0) Protein: 30   (Normal Range: Negative) Urobilinogen: 0.2   (Normal Range: 0-1) Nitrite: negative   (Normal Range: Negative) Leukocyte Esterace: moderate   (Normal Range: Negative)    Urine HCG: negative

## 2011-03-18 NOTE — Assessment & Plan Note (Signed)
Summary: pap only--rm 6   Vital Signs:  Patient profile:   20 year old female Menstrual status:  regular LMP:     12/14/2010 Height:      57 inches Weight:      114.75 pounds BMI:     24.92 Temp:     98.6 degrees F oral Pulse rate:   90 / minute Pulse rhythm:   regular Resp:     16 per minute BP sitting:   110 / 70  (right arm) Cuff size:   regular  Vitals Entered By: Mervin Kung CMA Duncan Dull) (March 08, 2011 11:31 AM) CC: Pt here for pap smear. Is Patient Diabetic? No Pain Assessment Patient in pain? no      Comments Pt completed Cefuroxime. Nicki Guadalajara Fergerson CMA Duncan Dull)  March 08, 2011 11:37 AM  LMP (date): 12/14/2010     Menstrual Status regular Enter LMP: 12/14/2010   Primary Care Provider:  D. Thomos Lemons DO  CC:  Pt here for pap smear.Marland Kitchen  History of Present Illness: Patient is a 20 year old female who presents today with chief complaint of abnormal vaginal discharge. she describes the discharge is clear to yellow in color. Discharge is associated with a fishy odor. Symptoms started several months ago. She reports one female partner within the last 6 months. She reports that she always uses condoms. She uses maneuver and as well for birth control. Vaginal discharge began before initiation of new ring.   Preventive Screening-Counseling & Management  Alcohol-Tobacco     Alcohol drinks/day: 0     Alcohol Counseling: not indicated; patient does not drink     Smoking Status: never     Tobacco Counseling: not indicated; no tobacco use  Allergies: 1)  ! Requip  Past History:  Past Medical History: Last updated: 02/22/2011 Hx of adjustment d/o with depression    (hospital admission-2009)  Exercise-induced asthma        Past Surgical History: Last updated: 02/22/2011 Left Knee Arthroscopic 2008  History of lumbar stenosis requiring surgery at 92 months of age  (1992)       Review of Systems       Denies fevers.  Denies dyspareunia.    Physical  Exam  General:  Well-developed,well-nourished,in no acute distress; alert,appropriate and cooperative throughout examination Breasts:  No mass, nodules, thickening, tenderness, bulging, retraction, inflamation, nipple discharge or skin changes noted.   Lungs:  Normal respiratory effort, chest expands symmetrically. Lungs are clear to auscultation, no crackles or wheezes. Heart:  Normal rate and regular rhythm. S1 and S2 normal without gallop, murmur, click, rub or other extra sounds. Abdomen:  Bowel sounds positive,abdomen soft and non-tender without masses, organomegaly or hernias noted. Genitalia:  Pelvic Exam:        External: normal female genitalia without lesions or masses        Vagina: normal without lesions or masses- narrow introitus        Cervix: normal without lesions or masses        Adnexa: normal bimanual exam without masses or fullness        Uterus: normal by palpation        Pap smear: performed   Impression & Recommendations:  Problem # 1:  VAGINAL DISCHARGE (ICD-623.5) Assessment New Wet prep, GC Chlamydia, and Pap smear were performed today. Await results and plan to treat accordingly.  Complete Medication List: 1)  Xopenex Hfa 45 Mcg/act Aero (Levalbuterol tartrate) .... 2 puffs qid as needed 2)  Nuvaring 0.12-0.015  Mg/24hr Ring (Etonogestrel-ethinyl estradiol) .... Use as directed 3)  Rozerem 8 Mg Tabs (Ramelteon) .... 1/2 tab by mouth at bedtime  Patient Instructions: 1)  We will contact you with your results.     Orders Added: 1)  Est. Patient Level III [[address]]      Current Allergies (reviewed today): ! REQUIP  Appended Document: lab orders    Clinical Lists Changes  Orders: Added new Service order of Pap Smear, Thin Prep ( Collection of) ([id number]) - Signed Added new Service order of Specimen Handling (28413) - Signed Added new Test order of T-Wet Prep by Molecular Probe [phone]) - Signed Added new Test order of T-Chlamydia & GC Probe,  Genital ([id number]) - Signed

## 2011-03-18 NOTE — Progress Notes (Signed)
  Phone Note Outgoing Call   Summary of Call: Left message for patient to return my call.  Initial call taken by: Lemont Fillers FNP,  March 09, 2011 10:04 AM  Follow-up for Phone Call        Spoke with patient re: results and metrogel.  She verbalizes understanding. Follow-up by: Lemont Fillers FNP,  March 09, 2011 4:16 PM    New/Updated Medications: METROGEL-VAGINAL 0.75 % GEL (METRONIDAZOLE) one applicator full in vagina at bedtime for 5 nights Prescriptions: METROGEL-VAGINAL 0.75 % GEL (METRONIDAZOLE) one applicator full in vagina at bedtime for 5 nights  #1 x 0   Entered and Authorized by:   Lemont Fillers FNP   Signed by:   Lemont Fillers FNP on 03/09/2011   Method used:   Electronically to        [facility] [phone][facility] (retail)       [address] [place], [place]  [address]       Ph: [phone]       Fax: [phone]   RxID:   [id number]

## 2011-03-23 NOTE — Letter (Signed)
   [place] [facility] [address] [place], [place]  [address]  [place] Phone: [phone]      March 17, 2011   [facility] [address] [place], [place] [address]  RE:  LAB RESULTS  Dear  [name],  The following is an interpretation of your most recent lab tests.  Please take note of any instructions provided or changes to medications that have resulted from your lab work.  Pap Smear: normal - follow up in 1 year  Your gonorrhea and chlamydia testing is negative.    Sincerely Yours,    Lemont Fillers FNP  Appended Document:  Mailed.

## 2011-04-06 ENCOUNTER — Ambulatory Visit (INDEPENDENT_AMBULATORY_CARE_PROVIDER_SITE_OTHER): Payer: Managed Care, Other (non HMO) | Admitting: Internal Medicine

## 2011-04-06 ENCOUNTER — Encounter: Payer: Self-pay | Admitting: Internal Medicine

## 2011-04-06 DIAGNOSIS — J069 Acute upper respiratory infection, unspecified: Secondary | ICD-10-CM

## 2011-04-06 MED ORDER — PREDNISONE 20 MG PO TABS: ORAL_TABLET | ORAL | Status: DC

## 2011-04-06 MED ORDER — HYDROCOD POLST-CHLORPHEN POLST 10-8 MG/5ML PO LQCR: 5.0000 mL | Freq: Every evening | ORAL | Status: DC | PRN

## 2011-04-06 NOTE — Progress Notes (Signed)
  Subjective:    Patient ID: Megan Sampson, female    DOB: 12/06/1991, 20 y.o.   MRN: [id number]  HPI 20 y/o c/o unresolved URI symptoms.  Tues - felt weak, tired Started getting tight in the chest.  [facility]   Still coughing.  Headaches.  Nasal congestion.  She was prescribed amox and cheritussin, and tessalon perles.  No improvement with abx use.  Cough is non productive.    Review of Systems No fever or chills    No past medical history on file.  History   Social History  . Marital Status: Single    Spouse Name: N/A    Number of Children: N/A  . Years of Education: N/A   Occupational History  . Not on file.   Social History Main Topics  . Smoking status: Never Smoker   . Smokeless tobacco: Not on file  . Alcohol Use: Not on file  . Drug Use: Not on file  . Sexually Active: Not on file   Other Topics Concern  . Not on file   Social History Narrative  . No narrative on file    No past surgical history on file.  No family history on file.  Allergies  Allergen Reactions  . Metrogel-Vaginal     Vaginal burning/stinging  . Ropinirole Hydrochloride     REACTION: Chest pain and shortness of breath    No current outpatient prescriptions on file prior to visit.    BP 108/60  Pulse 89  Temp(Src) 97.9 F (36.6 C) (Oral)  Resp 18  Wt 117 lb (53.071 kg)  SpO2 99%  LMP 02/05/2011    Objective:   Physical Exam  Constitutional: She appears well-developed and well-nourished.  HENT:  Head: Normocephalic and atraumatic.  Right Ear: External ear normal.  Left Ear: External ear normal.  Mouth/Throat: No oropharyngeal exudate.       Post nasal gtt.  Eyes: Right eye exhibits no discharge. Left eye exhibits no discharge.  Cardiovascular: Normal rate, regular rhythm and normal heart sounds.   Pulmonary/Chest: Effort normal and breath sounds normal. No respiratory distress. She has no wheezes. She has no rales.  Lymphadenopathy:    She  has no cervical adenopathy.          Assessment & Plan:

## 2011-04-06 NOTE — Patient Instructions (Signed)
Increase your fluid intake. Take ibuprofen 400 mg with evening meal.  Please call our office if your symptoms do not improve or gets worse.

## 2011-04-11 NOTE — Assessment & Plan Note (Signed)
I suspect pt's symptoms secondary to viral URI. She has symptoms of mild tracheobronchitis. Treat with short prednisone taper. Increase fluid intake  and use over-the-counter analgesics as needed.  Use tussionex for cough. Patient advised to call office if symptoms persist or worsen.

## 2011-04-29 ENCOUNTER — Encounter: Payer: Self-pay | Admitting: Internal Medicine

## 2011-04-30 ENCOUNTER — Ambulatory Visit: Payer: Managed Care, Other (non HMO) | Admitting: Internal Medicine

## 2011-04-30 ENCOUNTER — Ambulatory Visit (INDEPENDENT_AMBULATORY_CARE_PROVIDER_SITE_OTHER): Payer: Managed Care, Other (non HMO) | Admitting: Family

## 2011-04-30 ENCOUNTER — Encounter: Payer: Self-pay | Admitting: Family

## 2011-04-30 VITALS — BP 104/78 | HR 90 | Temp 98.7°F | Resp 16 | Ht <= 58 in | Wt 116.0 lb

## 2011-04-30 DIAGNOSIS — J40 Bronchitis, not specified as acute or chronic: Secondary | ICD-10-CM

## 2011-04-30 MED ORDER — AZITHROMYCIN 250 MG PO TABS: ORAL_TABLET | ORAL | Status: AC

## 2011-04-30 MED ORDER — FLUTICASONE-SALMETEROL 100-50 MCG/DOSE IN AEPB: 1.0000 | INHALATION_SPRAY | Freq: Two times a day (BID) | RESPIRATORY_TRACT | Status: DC

## 2011-04-30 MED ORDER — ALBUTEROL SULFATE HFA 108 (90 BASE) MCG/ACT IN AERS: 2.0000 | INHALATION_SPRAY | Freq: Four times a day (QID) | RESPIRATORY_TRACT | Status: DC | PRN

## 2011-04-30 MED ORDER — HYDROCOD POLST-CHLORPHEN POLST 10-8 MG/5ML PO LQCR: 5.0000 mL | Freq: Every evening | ORAL | Status: DC | PRN

## 2011-04-30 MED ORDER — ALBUTEROL SULFATE (2.5 MG/3ML) 0.083% IN NEBU
2.5000 mg | INHALATION_SOLUTION | Freq: Once | RESPIRATORY_TRACT | Status: AC
  Administered 2011-04-30: 2.5 mg via RESPIRATORY_TRACT

## 2011-04-30 NOTE — Patient Instructions (Signed)
Call if your symptoms worsen or if they do not improve in 48 to 72 hours.

## 2011-04-30 NOTE — Assessment & Plan Note (Signed)
Symptoms consistent with bronchitis. She is not wheezing today in the office. Patient's mother is requesting IM Solu-Medrol. I told her that since the patient was not wheezing at all today on exam I was uncomfortable treating her with Solu-Medrol, however I would give her a prescription for an inhaled steroid trial. Will also treat with Zithromax. She was given an albuterol nebulizer here in the office today.

## 2011-04-30 NOTE — Progress Notes (Signed)
  Subjective:    Patient ID: Megan Sampson, female    DOB: 1991/06/10, 20 y.o.   MRN: [id number]  HPI  Ms.  Vazquezis a 20 yr old female who presents today with chief complaint of cough/congestion.  Symptoms started 2-3 days a go. + phlegm yesterday- yellow.  Non-productive today.  Notes associated low grade temp 99.7 last night.  Denies nasal congestion.  Dry throat.  She has missed several days of school.      Review of Systems See HPI  Past Medical History  Diagnosis Date  . Depression     hx of d/o w/ depression (hospital admission - 2009  . Asthma     exercise- induced    History   Social History  . Marital Status: Single    Spouse Name: N/A    Number of Children: N/A  . Years of Education: N/A   Occupational History  . Not on file.   Social History Main Topics  . Smoking status: Never Smoker   . Smokeless tobacco: Not on file  . Alcohol Use: Not on file  . Drug Use: Not on file  . Sexually Active: Not on file   Other Topics Concern  . Not on file   Social History Narrative  . No narrative on file    Past Surgical History  Procedure Date  . Left knee arthroscopic 2008  . Lumbar stenosis 5442 (35 months of age)    Family History  Problem Relation Age of Onset  . Asthma Mother   . Allergies Mother   . Hyperlipidemia Maternal Grandfather   . Hypertension Maternal Grandfather   . Alcohol abuse Other     Allergies  Allergen Reactions  . Metrogel-Vaginal     Vaginal burning/stinging  . Ropinirole Hydrochloride     REACTION: Chest pain and shortness of breath    Current Outpatient Prescriptions on File Prior to Visit  Medication Sig Dispense Refill  . DISCONTD: chlorpheniramine-HYDROcodone (TUSSIONEX) 10-8 MG/5ML LQCR Take 5 mLs by mouth at bedtime as needed.  115 mL  0  . amoxicillin (AMOXIL) 875 MG tablet Take 875 mg by mouth 2 (two) times daily.        Marland Kitchen DISCONTD: benzonatate (TESSALON) 100 MG capsule Take 100 mg by mouth 2 (two) times daily as  needed. X 10 days       . DISCONTD: predniSONE (DELTASONE) 20 MG tablet One tab once daily x 5 days, then 1/2 tab once daily x 5 days  10 tablet  0   No current facility-administered medications on file prior to visit.    BP 104/78  Pulse 90  Temp(Src) 98.7 F (37.1 C) (Oral)  Resp 16  Ht 4' 9.01" (1.448 m)  Wt 116 lb 0.6 oz (52.635 kg)  BMI 25.10 kg/m2  SpO2 94%  LMP 02/05/2011       Objective:   Physical Exam General: Awake, alert, and in no acute distress Head: Normocephalic atraumatic Ears: Bilateral tympanic membranes are intact without erythema or bulging  mouth: Oropharynx moist, no erythema or exudates Respiratory: Breath sounds are clear to auscultation bilaterally, without wheezes rales or rhonchi Cardiovascular: S1-S2 regular rate and rhythm no murmur       Assessment & Plan:  Lot [id number] exp 10/12

## 2011-05-04 NOTE — Discharge Summary (Signed)
NAMETERETHA, CHALUPA               ACCOUNT NO.:  [id number]   MEDICAL RECORD NO.:  [id number]          PATIENT TYPE:  INP   LOCATION:  0105                          [facility]ILITY[facility]   PHYSICIAN:  Lalla Brothers, MDDATE OF BIRTH:  05/23/1991   DATE OF ADMISSION:  09/20/2008  DATE OF DISCHARGE:  09/26/2008                               DISCHARGE SUMMARY   IDENTIFICATION:  This 20 year-old female, eleventh grade student at [employer] was admitted emergently, voluntarily on  referral from her psychotherapist Madison Hickman, for inpatient  stabilization and treatment of suicide risk and anxious depression.  The  patient reports increasing panic attacks and is angry over disrupted  relations with boyfriend.  The patient does not want to live but is not  determining ways to dissipate her distress despite outpatient  psychotherapy and medications and ongoing treatment. For full details,  please see the typed admission assessment by Dr. Lucianne Muss.   SYNOPSIS OF PRESENT ILLNESS:  The patient resides with mother,  stepfather and half-brother age 81, with stepfather apparently being  adoptive of the patient.  The patient has no relationship with  biological father.  She misses school frequently despite having some  programming at [employer].  She had been employed at [employer] for  the last 4 months but thinks she may be released from the job because  she has been calling in sick.  Mother has had depression as have  maternal uncle and aunt.  Maternal uncle had alcohol abuse.  There is a  family history of cancer, stroke, and coronary heart disease.  The  patient has been in therapy weekly for the last month, at the time of  admission is taking Lexapro 10 mg nightly, clonazepam 0.25 mg nightly,  though she has doubled the dose herself and has lorazepam 1 mg t.i.d.  p.r.n.Marland Kitchen  Patient is slow to assume responsible confidence in problem  solving but then becomes angry at the  consequences.   INITIAL MENTAL STATUS EXAM:  Dr. Lucianne Muss noted that maternal great-  grandmother also had depression.  She noted that Dr. Lenise Arena had been  treating panic attacks for the last 3 months as well.  The patient has  no agoraphobia.  She is depressed and anxious with crying and initial  pattern of being retentive.  However, when she does talk, she has  intrusive and  is verbally disapproving of others frequently.  She has  no psychosis or mania.  She has no post-traumatic anxiety or  dissociation.  She has no delirium or intoxication.  She has suicidal  ideation.   LABORATORY FINDINGS:  CBC was normal with white count of 6.700,  hemoglobin 14.5, MCV of 90 and platelet count 236,000.  Basic metabolic  panel was normal with sodium 142, potassium 3.6, random glucose 72,  creatinine 0.67 and calcium 9.9.  Hepatic function panel was normal with  total bilirubin 0.6, albumin 4.2, AST 24, ALT 22 and GGT 10.  Urinalysis  revealed a small amount of occult blood with specific gravity of 1.025  with 7-10 RBCs, calcium oxalate  crystals and rare bacteria.  Reporting  regular menses and not sexually active, currently menstruating.  Urine  pregnancy test was negative.  Urine drug screen was negative with  creatinine of 188 mg/dL documenting adequate specimen.  Urine probe for  gonorrhea and chlamydia by DNA amplification were both negative.  Free  T4 was normal at 1.27 and TSH 0.710.  She had laboratory analysis  similarly in the emergency department September 15, 2008, when she  presented with panic symptoms, all of which were similarly negative  including alcohol and urine drug screen.   HOSPITAL COURSE AND TREATMENT:  General medical exam by Mallie Darting PA-  C noted no medication allergies and menarche at age 62.  Exam was  normal.  She was afebrile throughout hospital stay with maximum  temperature 98.1.  Her height was 144.8 cm and weight was 42 kg.  Initial supine blood pressure was  103/58 with heart rate of 58 and  standing blood pressure 100/63 with heart rate of 99.  At the time of  discharge, on discharge medications, supine blood pressure was 94/51  with heart rate of 59 and standing blood pressure 99/61 with heart rate  of 79.  The patient had no panic attacks during the hospital stay though  she did take a p.r.n. dose of Klonopin on one occasion.  She was  restructured to taking 0.5 mg of Klonopin, clonazepam every bedtime and  Lexapro at 5 mg in the morning and 10 mg at bedtime.  Her birth control  pill was continued.  The patient quickly worked through anxiety early in  the hospital stay and she began to dissipate anger and despair, she  manifested borderline interpersonal style initially.  Over the course of  the hospital stay she was able to resolve some of the consequences of  breakup with boyfriend and had become more collaborative with peers and  then family.  In the final family therapy session with mother and  stepfather, mother was most concerned that the patient had been silly  and may not be prepared for discharge.  The patient was more demanding  and constructive, addressing family concerns about trust, immaturity and  insecurity.  All family members were able to dissipate over  interpretations and affective doubt to prepare for effective discharge.  The patient required no seclusion or restraint during hospital stay.   FINAL DIAGNOSES:  AXIS I:  1.  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  2.  Panic disorder without agoraphobia.  3.  Oppositional defiant disorder (provisional diagnosis). 4. Other  interpersonal problem.  1. Parent-child problem.  6.  Other specified family circumstances.  AXIS II:  Diagnosis deferred.  AXIS III:  1.  Seasonal allergic rhinitis.  2.  Birth control pills.  3.  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  AXIS IV:  Stressors, peer relations, severe acute and chronic; family   moderate acute and chronic; phase of life severe acute and chronic.  AXIS V:  GAF on admission was 40 with highest in the last year estimated  at 75 and discharge GAF was 52.   PLAN:  The patient was discharged to mother in improved condition, free  of suicidal ideation.  She follows a regular diet and has no  restrictions on physical activity.  She has no wound care or pain  management needs.  Crisis and safety plans are outlined if needed.  She  is prescribed the following medication.  1. Lexapro 10  mg tablet, taking a half every morning and one every      bedtime, quantity #45 with no refill prescribed.  2. Clonazepam 0.5 mg every bedtime and one daily if needed for panic      anxiety, quantity #60 with no refill prescribed.  3. Debarah Crape 0.15/0.03 every bedtime, own home supply.  Her lorazepam      was discontinued.  She will see Dr. Lucianne Muss September 30, 2008 at 1500      at [phone], for outpatient psychiatric followup.  She will see      Madison Hickman September 26, 2008 at 1400 at [phone] for therapy.      Education on medication was provided including FDA warnings and      monitoring for any hypomania not evident during the hospital stay,      though the patient did become silly as she began to have improved      family relations.      Lalla Brothers, MD  Electronically Signed     GEJ/MEDQ  D:  10/03/2008  T:  10/03/2008  Job:  [id number]   cc:   Madison Hickman, LMFT  [address], [address] [place]  fax # [phone]   Nelly Rout, MD
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
## 2011-05-09 ENCOUNTER — Emergency Department (INDEPENDENT_AMBULATORY_CARE_PROVIDER_SITE_OTHER): Payer: Managed Care, Other (non HMO)

## 2011-05-09 ENCOUNTER — Emergency Department (HOSPITAL_BASED_OUTPATIENT_CLINIC_OR_DEPARTMENT_OTHER)
Admission: EM | Admit: 2011-05-09 | Discharge: 2011-05-09 | Disposition: A | Discharge status: Home / Self Care | Payer: Managed Care, Other (non HMO) | Attending: Emergency Medicine | Admitting: Emergency Medicine

## 2011-05-09 DIAGNOSIS — M4120 Other idiopathic scoliosis, site unspecified: Secondary | ICD-10-CM

## 2011-05-09 DIAGNOSIS — R0989 Other specified symptoms and signs involving the circulatory and respiratory systems: Secondary | ICD-10-CM

## 2011-05-09 DIAGNOSIS — J069 Acute upper respiratory infection, unspecified: Secondary | ICD-10-CM | POA: Insufficient documentation

## 2011-05-09 DIAGNOSIS — R05 Cough: Secondary | ICD-10-CM

## 2011-05-09 DIAGNOSIS — R079 Chest pain, unspecified: Secondary | ICD-10-CM

## 2011-05-09 LAB — RAPID STREP SCREEN (MED CTR MEBANE ONLY): Streptococcus, Group A Screen (Direct): NEGATIVE

## 2011-05-10 ENCOUNTER — Telehealth: Payer: Self-pay | Admitting: *Deleted

## 2011-05-10 NOTE — Telephone Encounter (Signed)
[facility] Triage Call Report Triage Record Num: [id number] Operator: Tomasita Crumble Patient Name: Megan Sampson Call Date & Time: 05/09/2011 10:31:38AM Patient Phone: [phone] PCP: Thomos Lemons Patient Gender: Female PCP Fax : [phone] Patient DOB: 1991-11-20 Practice Name: [facility] Reason for Call: Misty Stanley, Mother, calling regarding Cough/Congestion. PCP is Thomos Lemons. Callback number is [phone]. Wt. 115 lbs. Temp 101 tympanic. Caller reports cold/ URI sx > 1 month; has been on antibiotics and Prednisone without relief. Reports cough seems to be worst sx., headache and chest hurts wtih coughing. Uses [facility] TID. Emergent sx ruled out. Urgent Home Care per Asthma Attack protocol. Follow up scheduled. Protocol(s) Used: Asthma Attack (Pediatric) Recommended Outcome per Protocol: Urgent Home Treatment with Follow-Up Call Reason for Outcome: [1] Continuous wheezing or coughing AND [2] hasn't used neb or inhaler twice AND [3] it's available Care Advice: ~ CARE ADVICE given per Asthma Attack (Pediatric) guideline. CALL BACK IF: - Your child becomes worse ~ URGENT HOME TREATMENT WITH FOLLOW-UP CALL: CALL CENTER PROVIDES RN CALL-BACKS: - Your child will usually improve with the home treatment advice I give you - I'll call you back in 30-60 minutes to see how your child is doing - Call me back immediately if: Your child becomes worse before my follow-up call CALL CENTER DOES NOT PROVIDE RN CALL-BACKS: - I'll explain how to treat your child's symptom - After finishing the home treatment, call me back (in 30-60 minutes) and tell me how your child is doing - Go to the ED immediately without calling back if: Your child BECOMES WORSE or DOESN'T IMPROVE with treatment RN RESPONSE TO FOLLOW-UP CALL: - Evaluate child's response to home treatment - If unchanged or worse, refer to ED Now - If improved or resolved, review remaining triage questions and give care advice. ~ 05/09/2011  10:46:43AM Page 1 of 1 [email or web address]

## 2011-05-10 NOTE — Telephone Encounter (Signed)
[facility] Triage Call Report Triage Record Num: [id number] Operator: Tomasita Crumble Patient Name: Megan Sampson Call Date & Time: 05/09/2011 11:21:50AM Patient Phone: [phone] PCP: Thomos Lemons Patient Gender: Female PCP Fax : [phone] Patient DOB: [birth year] Practice Name: [facility] Reason for Call: Misty Stanley, Mother, calling regarding Cough/Congestion. PCP is Thomos Lemons. Callback number is [phone]. Follow up related to Asthma Attack protocol. Caller states child has stopped coughing and fell asleep. Reports history of being on Prednisone within last month. Advised follow up with provider within 24 hours per Asthma Attack protocol. Protocol(s) Used: Asthma Attack (Pediatric) Recommended Outcome per Protocol: See Provider within 24 hours Reason for Outcome: Frequent need for steroid bursts Care Advice: HAY FEVER: For hay fever symptoms, it's OK to give antihistamines (Reasons: poor control of allergic rhinitis makes asthma worse whereas antihistamines don't make asthma worse). ~ ~ REMOVE ALLERGENS: Give a shower to remove pollens, animal dander, or other allergens from the body and hair. ~ CARE ADVICE given per Asthma Attack (Pediatric) guideline. ASTHMA RESCUE MEDICINE: Start your child's quick relief medicine (e.g., albuterol, xopenex) ([place]: salbutamol) at the first sign of any coughing or shortness of breath (don't wait for wheezing). - Give inhaler (2 puffs each time) or neb treatment every 4-6 hours. - The best "cough medicine" for a child with asthma is always the asthma medicine. (NOTE: don't use cough suppressants because coughing up mucus is helpful) (COUGH DROPS may help a tickly cough if > 41 years old.) - Continue the asthma rescue medicine until no wheezing or coughing for 48 hours. ~ CALL BACK IF: - Difficulty breathing occurs - Wheezing becomes worse - Inhaled asthma medicine (neb or inhaler) is needed more often than every 4 hours - Your child becomes  worse ~ FLUIDS: Encourage a normal intake of clear fluids (e.g., water). (Reason: Being well hydrated makes it easier to cough up the sticky lung mucus.) ~ ~ HUMIDIFIER: If the air is dry, use a humidifier to prevent drying of the upper airway. SEE PHYSICIAN WITHIN 24 HOURS IF OFFICE WILL BE OPEN: Your child needs to be examined within the next 24 hours. Call your child's doctor when the office opens, and make an appointment. IF OFFICE WILL BE CLOSED: Your child needs to be examined within the next 24 hours. Go to _________ at your convenience. ~ ASTHMA CONTROLLER MEDICINE: If your child is using a controller medicine (e.g., inhaled steroids or cromolyn), continue to give it as directed. ~ AVOID TRIGGERS: Avoid known triggers of asthma attacks (e.g., tobacco smoke, cats, other pets, feather pillows, menthol vapors, exercise) ~ 05/09/2011 11:30:56AM Page 1 of 1 [email or web address]

## 2011-05-12 ENCOUNTER — Encounter: Payer: Self-pay | Admitting: Internal Medicine

## 2011-05-12 ENCOUNTER — Ambulatory Visit (INDEPENDENT_AMBULATORY_CARE_PROVIDER_SITE_OTHER): Payer: Managed Care, Other (non HMO) | Admitting: Internal Medicine

## 2011-05-12 DIAGNOSIS — J40 Bronchitis, not specified as acute or chronic: Secondary | ICD-10-CM

## 2011-05-12 DIAGNOSIS — B009 Herpesviral infection, unspecified: Secondary | ICD-10-CM

## 2011-05-12 DIAGNOSIS — B001 Herpesviral vesicular dermatitis: Secondary | ICD-10-CM | POA: Insufficient documentation

## 2011-05-12 DIAGNOSIS — J45990 Exercise induced bronchospasm: Secondary | ICD-10-CM

## 2011-05-12 MED ORDER — CEFUROXIME AXETIL 500 MG PO TABS: 500.0000 mg | ORAL_TABLET | Freq: Two times a day (BID) | ORAL | Status: AC

## 2011-05-12 MED ORDER — VALACYCLOVIR HCL 1 G PO TABS: 1000.0000 mg | ORAL_TABLET | Freq: Two times a day (BID) | ORAL | Status: AC

## 2011-05-12 MED ORDER — PREDNISONE 10 MG PO TABS: ORAL_TABLET | ORAL | Status: DC

## 2011-05-12 NOTE — Assessment & Plan Note (Signed)
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  Use valtrex as directed

## 2011-05-12 NOTE — Patient Instructions (Addendum)
Please call our office if your symptoms do not improve or gets worse.  

## 2011-05-12 NOTE — Assessment & Plan Note (Signed)
I suspect persistent cough from sinusitis vs post viral URI tracheobronchitis. Treat with ceftin 500 mg po bid and prednisone taper Patient advised to call office if symptoms persist or worsen.

## 2011-05-12 NOTE — Assessment & Plan Note (Signed)
Pt also concerned about underlying asthma.  I suggest pt get over current bronchitis.   Perform spirometry at next OV

## 2011-05-12 NOTE — Progress Notes (Signed)
  Subjective:    Patient ID: Megan Sampson, female    DOB: Jan 09, 1991, 20 y.o.   MRN: [id number]  HPI 20 y/o female for follow up re:  Bronchitis.  She was first since by NP on 04/30/2011 for possible bronchitis.  She was started on z pak but noticed persistent severe cough.  She finished full course of abx.  She was later seen in [facility] ER.  CXR was negative for pna but she still has productive cough of yellow/green sputum.   She denies shortness of breath.  Chest hurts from coughing.   Review of Systems    Pt also co chronic cold sore on right upper lip that will not go away.  Past Medical History  Diagnosis Date  . Depression     hx of d/o w/ depression (hospital admission - 2009  . Asthma     exercise- induced    History   Social History  . Marital Status: Single    Spouse Name: N/A    Number of Children: N/A  . Years of Education: N/A   Occupational History  . Not on file.   Social History Main Topics  . Smoking status: Never Smoker   . Smokeless tobacco: Not on file  . Alcohol Use: Not on file  . Drug Use: Not on file  . Sexually Active: Not on file   Other Topics Concern  . Not on file   Social History Narrative  . No narrative on file    Past Surgical History  Procedure Date  . Left knee arthroscopic 2008  . Lumbar stenosis 5739 (66 months of age)    Family History  Problem Relation Age of Onset  . Asthma Mother   . Allergies Mother   . Hyperlipidemia Maternal Grandfather   . Hypertension Maternal Grandfather   . Alcohol abuse Other     Allergies  Allergen Reactions  . Metrogel-Vaginal     Vaginal burning/stinging  . Ropinirole Hydrochloride     REACTION: Chest pain and shortness of breath    Current Outpatient Prescriptions on File Prior to Visit  Medication Sig Dispense Refill  . albuterol (PROVENTIL HFA;VENTOLIN HFA) 108 (90 BASE) MCG/ACT inhaler Inhale 2 puffs into the lungs every 6 (six) hours as needed for wheezing.  1 Inhaler  0    . Fluticasone-Salmeterol (ADVAIR DISKUS) 100-50 MCG/DOSE AEPB Inhale 1 puff into the lungs 2 (two) times daily.  1 each  0  . chlorpheniramine-HYDROcodone (TUSSIONEX) 10-8 MG/5ML LQCR Take 5 mLs by mouth at bedtime as needed.  115 mL  0  . DISCONTD: amoxicillin (AMOXIL) 875 MG tablet Take 875 mg by mouth 2 (two) times daily.          BP 90/60  Pulse 88  Temp(Src) 98 F (36.7 C) (Oral)  Resp 16  SpO2 99%    Objective:   Physical Exam    Constitutional: Appears well-developed and well-nourished. No distress.  HENT:  Right Ear: External ear normal.  Left Ear: External ear normal.  Mouth/Throat: mild redness of oropharynx,  Shallow ulcer of right upper lip Eyes: Conjunctivae are normal. Pupils are equal, round, and reactive to light.  Neck: no adenopathy.  Mild tenderness of left upper neck Cardiovascular: Normal rate, regular rhythm and normal heart sounds.  Exam reveals no gallop and no friction rub.   No murmur heard. Pulmonary/Chest: Effort normal and breath sounds normal.  No wheezes. No rales.   Assessment & Plan:

## 2011-05-18 ENCOUNTER — Telehealth: Payer: Self-pay | Admitting: *Deleted

## 2011-05-18 NOTE — Telephone Encounter (Signed)
Patient called and left voice message stating she was seen last week given a Rx for Prednisone,and it seems to be working well for her. She is complaining that she is having some pressure,pain, and echoing in her right ear. She would like to know what she should do

## 2011-05-18 NOTE — Telephone Encounter (Signed)
She should complete abx dr. Artist Pais prescribed and call if her symptoms worsen or if they are not resolved when she completes abx.

## 2011-05-19 NOTE — Telephone Encounter (Signed)
Call placed to patient at [phone], no answer. A detailed voice message was left informing patient per  [facility]'s instructions. She was advised to call back if no improvement in her symptoms after she has completed the antibiotics

## 2011-06-02 ENCOUNTER — Telehealth: Payer: Self-pay | Admitting: *Deleted

## 2011-06-02 ENCOUNTER — Encounter: Payer: Self-pay | Admitting: Family Medicine

## 2011-06-02 ENCOUNTER — Ambulatory Visit (INDEPENDENT_AMBULATORY_CARE_PROVIDER_SITE_OTHER): Payer: Managed Care, Other (non HMO) | Admitting: Family Medicine

## 2011-06-02 DIAGNOSIS — G43909 Migraine, unspecified, not intractable, without status migrainosus: Secondary | ICD-10-CM

## 2011-06-02 DIAGNOSIS — R358 Other polyuria: Secondary | ICD-10-CM

## 2011-06-02 DIAGNOSIS — Z79899 Other long term (current) drug therapy: Secondary | ICD-10-CM

## 2011-06-02 DIAGNOSIS — R3589 Other polyuria: Secondary | ICD-10-CM

## 2011-06-02 LAB — POCT URINALYSIS DIPSTICK
Ketones, UA: NEGATIVE
Protein, UA: NEGATIVE
Spec Grav, UA: 1.02
Urobilinogen, UA: 0.2

## 2011-06-02 LAB — GLUCOSE, POCT (MANUAL RESULT ENTRY): POC Glucose: 90

## 2011-06-02 MED ORDER — NAPROXEN 500 MG PO TABS: 500.0000 mg | ORAL_TABLET | Freq: Two times a day (BID) | ORAL | Status: AC

## 2011-06-02 MED ORDER — KETOROLAC TROMETHAMINE 30 MG/ML IJ SOLN
30.0000 mg | Freq: Once | INTRAMUSCULAR | Status: AC
  Administered 2011-06-02: 30 mg via INTRAVENOUS

## 2011-06-02 MED ORDER — PROMETHAZINE HCL 25 MG/ML IJ SOLN
25.0000 mg | Freq: Once | INTRAMUSCULAR | Status: AC
  Administered 2011-06-02: 25 mg via INTRAMUSCULAR

## 2011-06-02 MED ORDER — SUMATRIPTAN SUCCINATE 50 MG PO TABS: 50.0000 mg | ORAL_TABLET | Freq: Once | ORAL | Status: DC | PRN

## 2011-06-02 NOTE — Telephone Encounter (Signed)
Triage Record Num: [id number] Operator: Edgar Frisk Patient Name: Megan Sampson Call Date & Time: 06/02/2011 2:39:32AM Patient Phone: [phone] PCP: Thomos Lemons Patient Gender: Female PCP Fax : [phone] Patient DOB: 1991/07/09 [facility] Name[facility] Reason for Call: Pt reports she has had headache x 24 hrs and is getting worse. Now has neck pain and dizziness, very light sensitive. Advised ED for evaluation now per Headache Guideline pt agrees Protocol(s) Used: Headache Recommended Outcome per Protocol: See ED Immediately Reason for Outcome: New onset of severe dizziness/vertigo Care Advice: ~ Another adult should drive. Write down provider's name. List or place the following in a bag for transport with the patient: current prescription and/or nonprescription medications; alternative treatments, therapies and medications; and street drugs.

## 2011-06-02 NOTE — Patient Instructions (Signed)
Migraine Headache   A migraine is very bad pain on one or both sides of your head. The cause of a migraine is not always known.   HOME CARE   Many medicines can help migraine pain or keep migraines from coming back. Your doctor can help you decide on a medicine or treatment program.   If you or your child gets a migraine, it may help to lie down in a dark, quiet room.   Keep a headache journal. This may help find out what is causing the headaches. For example, write down:   What you eat and drink.   How much sleep you get.   Any change to your diet or medicines.   MIGRAINE SYMPTOMS   Sometimes, an aura can occur before you get a migraine. An aura is a group of problems (symptoms) that can predict a migraine. These can include:   Seeing flashing lights, bright spots, or zig-zag lines.   Tunnel vision.   Trouble talking.   Feelings of numbness.   Muscle weakness.   A migraine includes one or more of the following problems:   Pain on one or both sides of the head.   Pain that feels like pounding or throbbing inside the head.   Pain that is bad enough to keep you from doing daily activities.   Feeling sick to your stomach (nauseous).   Throwing up (vomiting).   Pain with exposure to bright lights, loud noises, or activity.   MIGRAINE TRIGGERS   A migraine can be "triggered" or caused by different things, such as:   Alcohol.   Smoking.   Stress.   Your period (female menstruation) may be related.   Aged cheeses.   Foods or drinks that contain nitrates, glutamate, aspartame, or tyramine.  Lack of sleep.   Chocolate.   Caffeine.   Hunger.   Medicines, such as nitroglycerine (used to treat chest pain), birth control pills, estrogen, and some blood pressure medicines.    DIAGNOSIS   A migraine headache is often diagnosed based on:   Symptoms.   Physical exam.   A CT scan of the head. This may be ordered to see if the headaches are caused by other medical problems.   GET HELP RIGHT AWAY IF:   The medicine you or your child  was given does not work.   The pain begins again.   The neck is stiff.   You or your child is having trouble seeing.   The muscles are weak or you or your child loses muscle control.   There are new, very bad symptoms.   You or your child loses balance.   You or your child has trouble walking.   You or your child feels faint or passes out.   MAKE SURE YOU:   Understand these instructions.   Will watch this condition.   Will get help right away if you or your child is not doing well or gets worse.   Document Released: 09/14/2008 Document Re-Released: 03/02/2010   [employer]® Patient Information ©2011 [employer].

## 2011-06-02 NOTE — Telephone Encounter (Signed)
Call placed to patient at [phone], female individual stated patient was not available,  Call placed to patient at [phone], no answer. A detailed voice message was left for patient to return call regarding status update on ER visit and how she is feeling

## 2011-06-02 NOTE — Telephone Encounter (Signed)
Please call patient and make sure that she was evaluated in the ED.

## 2011-06-04 NOTE — Telephone Encounter (Signed)
Call placed to patient at [phone], patient states that she did not go to the ER because her parents did not feel it was urgent enough for and ER visit. She stated that  she was seen by a physician in [facility] and was given 2 injections and that she is doing better. She believes that her symptoms may be related to her birth control; and will be following up with her gynecologist.

## 2011-06-05 DIAGNOSIS — G43909 Migraine, unspecified, not intractable, without status migrainosus: Secondary | ICD-10-CM | POA: Insufficient documentation

## 2011-06-05 NOTE — Progress Notes (Signed)
Megan Sampson [id number] August 29, 1991 06/05/2011      Progress Note-Follow Up  Subjective  Chief Complaint  Chief Complaint  Patient presents with  . Migraine    X 2 days  . Sore Throat    X 2 days- more scratchy    HPI  Patient is a 20 year old Caucasian female in today for evaluation of her migraine. She is scheduled with tension headaches and sleep for many years. Her mother does have trouble migraine headaches. Yesterday she developed a severe headache with nausea, vomiting, photophobia, phonophobia. Has [place] citizen in the middle of the headache and he tripped and her mother in the Cipro can headache she has a persistent symptoms today. Of note she did remove her NuvaRing yesterday but has been on maneuvering for the past year and this has never occurred before. Of note she also is under a great deal is dressed working a significant hours going to school and sleeping poorly. She gets very little sleep she skips meals she eats and drinks it consistently and is not getting any exercise at this time. There's been no recent fevers, chills, illness, chest pain, GI or GU concerns until this migraine started. She has mild irritation in her throat in the last 24 hours but no pain, no nasal congestion, cough or other signs of acute illness.  Past Medical History  Diagnosis Date  . Depression     hx of d/o w/ depression (hospital admission - 2009  . Asthma     exercise- induced    Past Surgical History  Procedure Date  . Left knee arthroscopic 2008  . Lumbar stenosis 3610 (79 months of age)    Family History  Problem Relation Age of Onset  . Asthma Mother   . Allergies Mother   . Hyperlipidemia Maternal Grandfather   . Hypertension Maternal Grandfather   . Alcohol abuse Other     History   Social History  . Marital Status: Single    Spouse Name: N/A    Number of Children: N/A  . Years of Education: N/A   Occupational History  . Not on file.   Social History Main  Topics  . Smoking status: Never Smoker   . Smokeless tobacco: Never Used  . Alcohol Use: No  . Drug Use: No  . Sexually Active: Yes -- Female partner(s)   Other Topics Concern  . Not on file   Social History Narrative  . No narrative on file    Current Outpatient Prescriptions on File Prior to Visit  Medication Sig Dispense Refill  . albuterol (PROVENTIL HFA;VENTOLIN HFA) 108 (90 BASE) MCG/ACT inhaler Inhale 2 puffs into the lungs every 6 (six) hours as needed for wheezing.  1 Inhaler  0  . valACYclovir (VALTREX) 1000 MG tablet Take 1 tablet (1,000 mg total) by mouth 2 (two) times daily. For cold sores  14 tablet  0  . chlorpheniramine-HYDROcodone (TUSSIONEX) 10-8 MG/5ML LQCR Take 5 mLs by mouth at bedtime as needed.  115 mL  0  . Fluticasone-Salmeterol (ADVAIR DISKUS) 100-50 MCG/DOSE AEPB Inhale 1 puff into the lungs 2 (two) times daily.  1 each  0  . predniSONE (DELTASONE) 10 MG tablet 3 tabs by mouth once daily x 3 days, 2 tabs once daily x 3 days, 1 tab once daily x 3 days, then 1/2 tab once daily x 3 days  20 tablet  0    Allergies  Allergen Reactions  . Metrogel-Vaginal     Vaginal burning/stinging  .  Ropinirole Hydrochloride     REACTION: Chest pain and shortness of breath    Review of Systems  Review of Systems  Constitutional: Negative for fever, chills and malaise/fatigue.  HENT: Negative for nosebleeds and congestion.   Eyes: Positive for photophobia. Negative for blurred vision, double vision, pain, discharge and redness.  Respiratory: Negative for shortness of breath.   Cardiovascular: Negative for chest pain, palpitations and leg swelling.  Gastrointestinal: Positive for nausea and vomiting. Negative for heartburn, abdominal pain, diarrhea, constipation, blood in stool and melena.  Genitourinary: Negative for dysuria.  Musculoskeletal: Negative for falls.  Skin: Negative for rash.  Neurological: Negative for loss of consciousness and headaches.    Endo/Heme/Allergies: Negative for polydipsia.  Psychiatric/Behavioral: Negative for depression and suicidal ideas. The patient is not nervous/anxious and does not have insomnia.     Objective  BP 113/74  Pulse 93  Temp(Src) 98.1 F (36.7 C) (Oral)  Ht 4\' 9"  (1.448 m)  Wt 115 lb 12.8 oz (52.527 kg)  BMI 25.06 kg/m2  SpO2 100%  LMP 06/01/2011  Physical Exam  See Below  Lab Results  Component Value Date   TSH 2.835 02/18/2010     Assessment & Plan  Migraine Patient has been troubled with headaches in the past but never to this extent. She has struggled with photophobia, phonophobia, nausea, vomiting and headache for the last 24 hours. She did take one of her mother's Sumatriptans but did not get relief. Does note mom has a severe trouble with headaches. She removed her Nuvaring yesterday right before the HA started but she has been using that for the past year without previous difficulty. She is under a great deal of stress, going to school and working long hours, getting very little sleep and eating and drinking poorly. She is given a shot of Toradol and Promethazine and sent home with her mother to hydrate and rest. They are advised to take sumatriptan faster if migraine should recur, use Naproxen for any initial HA and counseled at length for need for lifestyle modifications to try and avoid another Migraine. Consider Seasonique or Seasonalle low varieties F/u with pmd or OB/GYN if migraines become recurrent, may need to consider trying a different hormone, if Nuvaring is considered a contributing factor    Physical Exam  Constitutional: She is oriented to person, place, and time. She appears distressed.  HENT:  Head: Normocephalic and atraumatic.  Eyes: Conjunctivae are normal.  Neck: Neck supple. No thyromegaly present.  Cardiovascular: Normal rate, regular rhythm and normal heart sounds.   No murmur heard. Pulmonary/Chest: Effort normal and breath sounds normal. She has  no wheezes.  Abdominal: She exhibits no distension and no mass.  Musculoskeletal: She exhibits no edema.  Lymphadenopathy:    She has no cervical adenopathy.  Neurological: She is alert and oriented to person, place, and time.  Skin: Skin is warm and dry. No rash noted. She is not diaphoretic.  Psychiatric: Memory, affect and judgment normal.

## 2011-06-05 NOTE — Assessment & Plan Note (Addendum)
Patient has been troubled with headaches in the past but never to this extent. She has struggled with photophobia, phonophobia, nausea, vomiting and headache for the last 24 hours. She did take one of her mother's Sumatriptans but did not get relief. Does note mom has a severe trouble with headaches. She removed her Nuvaring yesterday right before the HA started but she has been using that for the past year without previous difficulty. She is under a great deal of stress, going to school and working long hours, getting very little sleep and eating and drinking poorly. She is given a shot of Toradol and Promethazine and sent home with her mother to hydrate and rest. They are advised to take sumatriptan faster if migraine should recur, use Naproxen for any initial HA and counseled at length for need for lifestyle modifications to try and avoid another Migraine. Consider Seasonique or Seasonalle low varieties F/u with pmd or OB/GYN if migraines become recurrent, may need to consider trying a different hormone, if Nuvaring is considered a contributing factor

## 2011-06-08 NOTE — Telephone Encounter (Signed)
Patient was seen in office on 05/12/2011

## 2011-06-14 ENCOUNTER — Ambulatory Visit (INDEPENDENT_AMBULATORY_CARE_PROVIDER_SITE_OTHER): Payer: Managed Care, Other (non HMO) | Admitting: Family

## 2011-06-14 ENCOUNTER — Encounter: Payer: Self-pay | Admitting: Family

## 2011-06-14 ENCOUNTER — Ambulatory Visit: Payer: Managed Care, Other (non HMO) | Admitting: Internal Medicine

## 2011-06-14 DIAGNOSIS — J45990 Exercise induced bronchospasm: Secondary | ICD-10-CM

## 2011-06-14 DIAGNOSIS — J309 Allergic rhinitis, unspecified: Secondary | ICD-10-CM | POA: Insufficient documentation

## 2011-06-14 MED ORDER — ALBUTEROL SULFATE (2.5 MG/3ML) 0.083% IN NEBU
2.5000 mg | INHALATION_SOLUTION | RESPIRATORY_TRACT | Status: DC
  Administered 2011-06-14: 2.5 mg via RESPIRATORY_TRACT

## 2011-06-14 MED ORDER — FLUTICASONE PROPIONATE 50 MCG/ACT NA SUSP: 2.0000 | Freq: Every day | NASAL | Status: DC

## 2011-06-14 NOTE — Progress Notes (Signed)
Subjective:    Patient ID: CEIL RODERICK, female    DOB: 12-Apr-1991, 20 y.o.   MRN: [id number]  HPI  Ms. Clinton Gallant is a 20 year old female who presents today with chief complaint of nasal congestion.  Clear nasal discharge.  Denies fever.  Mild AM HA's.  Asthma- notes that she sometimes she uses her albuterol inhaler a couple times a week.    She reports that her GYN has discontinued her OCP due to migraine- she will be transitioning to the mirena.    Review of Systems    history of present illness  Past Medical History  Diagnosis Date  . Depression     hx of d/o w/ depression (hospital admission - 2009  . Asthma     exercise- induced    History   Social History  . Marital Status: Single    Spouse Name: N/A    Number of Children: N/A  . Years of Education: N/A   Occupational History  . Not on file.   Social History Main Topics  . Smoking status: Never Smoker   . Smokeless tobacco: Never Used  . Alcohol Use: No  . Drug Use: No  . Sexually Active: Yes -- Female partner(s)   Other Topics Concern  . Not on file   Social History Narrative  . No narrative on file    Past Surgical History  Procedure Date  . Left knee arthroscopic 2008  . Lumbar stenosis 7239 (46 months of age)    Family History  Problem Relation Age of Onset  . Asthma Mother   . Allergies Mother   . Hyperlipidemia Maternal Grandfather   . Hypertension Maternal Grandfather   . Alcohol abuse Other     Allergies  Allergen Reactions  . Metrogel-Vaginal     Vaginal burning/stinging  . Ropinirole Hydrochloride     REACTION: Chest pain and shortness of breath    Current Outpatient Prescriptions on File Prior to Visit  Medication Sig Dispense Refill  . albuterol (PROVENTIL HFA;VENTOLIN HFA) 108 (90 BASE) MCG/ACT inhaler Inhale 2 puffs into the lungs every 6 (six) hours as needed for wheezing.  1 Inhaler  0  . naproxen (NAPROSYN) 500 MG tablet Take 1 tablet (500 mg total) by mouth 2 (two) times  daily with a meal.  60 tablet  2  . SUMAtriptan (IMITREX) 50 MG tablet Take 1 tablet (50 mg total) by mouth once as needed for migraine.  15 tablet  0  . valACYclovir (VALTREX) 1000 MG tablet Take 1 tablet (1,000 mg total) by mouth 2 (two) times daily. For cold sores  14 tablet  0  . chlorpheniramine-HYDROcodone (TUSSIONEX) 10-8 MG/5ML LQCR Take 5 mLs by mouth at bedtime as needed.  115 mL  0  . Fluticasone-Salmeterol (ADVAIR DISKUS) 100-50 MCG/DOSE AEPB Inhale 1 puff into the lungs 2 (two) times daily.  1 each  0  . predniSONE (DELTASONE) 10 MG tablet 3 tabs by mouth once daily x 3 days, 2 tabs once daily x 3 days, 1 tab once daily x 3 days, then 1/2 tab once daily x 3 days  20 tablet  0   No current facility-administered medications on file prior to visit.    BP 110/76  Pulse 87  Temp(Src) 97.5 F (36.4 C) (Oral)  Resp 18  Wt 116 lb (52.617 kg)  LMP 06/01/2011    Objective:   Physical Exam  Constitutional: She appears well-developed and well-nourished.  HENT:  Head: Normocephalic and  atraumatic.  Right Ear: Tympanic membrane normal.  Left Ear: Tympanic membrane normal.  Mouth/Throat: Uvula is midline, oropharynx is clear and moist and mucous membranes are normal.  Cardiovascular: Normal rate and regular rhythm.   Pulmonary/Chest: Effort normal and breath sounds normal.  Psychiatric: She has a normal mood and affect. Her behavior is normal. Judgment and thought content normal.          Assessment & Plan:

## 2011-06-14 NOTE — Patient Instructions (Signed)
Please complete your lab work on the first floor.  Call if your symptoms worsen or if they do not improved with the addition of zyrtec and flonase.

## 2011-06-14 NOTE — Assessment & Plan Note (Signed)
Recommended that the patient had Zyrtec once daily as well as Flonase. She will complete her breast panel for [facility] allergens. If significant positive results consider referral to allergist. She is agreeable to this plan.she did undergo pre-and post bronchodilator spirometry today which did not note significant reversibility.

## 2011-06-15 LAB — ~~LOC~~ ALLERGY PANEL
Allergen, D pternoyssinus,d7: <0.1 kU/L (ref <0.35)
Allergen, Mulberry, t76: <0.1 kU/l (ref <0.35)
Alternaria Alternata: <0.1 kU/L (ref <0.35)
Aspergillus fumigatus, IgG: <0.1 kU/L (ref <0.35)
Bermuda Grass: <0.1 kU/L (ref <0.35)
Box Elder IgE: <0.1 kU/L (ref <0.35)
Cat Dander: <0.1 kU/L (ref <0.35)
Cladosporium Herbarum: <0.1 kU/L (ref <0.35)
Common Ragweed: <0.1 kU/L (ref <0.35)
Dog Dander: <0.1 kU/L (ref <0.35)
Plantain: <0.1 kU/L (ref <0.35)
Rough Pigweed  IgE: <0.1 kU/L (ref <0.35)
Sheep Sorrel IgE: <0.1 kU/L (ref <0.35)
Stemphylium Botryosum: <0.1 kU/L (ref <0.35)
Sweet Gum: <0.1 kU/L (ref <0.35)

## 2011-07-14 ENCOUNTER — Encounter: Payer: Self-pay | Admitting: Family

## 2011-07-14 ENCOUNTER — Ambulatory Visit (INDEPENDENT_AMBULATORY_CARE_PROVIDER_SITE_OTHER): Payer: Managed Care, Other (non HMO) | Admitting: Family

## 2011-07-14 DIAGNOSIS — L259 Unspecified contact dermatitis, unspecified cause: Secondary | ICD-10-CM

## 2011-07-14 DIAGNOSIS — L239 Allergic contact dermatitis, unspecified cause: Secondary | ICD-10-CM | POA: Insufficient documentation

## 2011-07-14 DIAGNOSIS — N949 Unspecified condition associated with female genital organs and menstrual cycle: Secondary | ICD-10-CM | POA: Insufficient documentation

## 2011-07-14 MED ORDER — BETAMETHASONE DIPROPIONATE 0.05 % EX CREA: TOPICAL_CREAM | Freq: Two times a day (BID) | CUTANEOUS | Status: AC

## 2011-07-14 NOTE — Progress Notes (Signed)
Subjective:    Patient ID: Megan Sampson, female    DOB: 1991/04/22, 20 y.o.   MRN: [id number]  HPI  [name] is a 20 year old female who presents today with the following concerns:  1) Rash upper thighs since Saturday- tried A and D and hydrocortisone, calamine.  Only slight improvement. She does report that she recently started using a new soap.  2) pinching sensation in the vagina for one day-Mirena placed on Saturday.  She reports that this was a difficult procedure as they needed to dilate her cervix.    Review of Systems See history of present illness  Past Medical History  Diagnosis Date  . Depression     hx of d/o w/ depression (hospital admission - 2009  . Asthma     exercise- induced    History   Social History  . Marital Status: Single    Spouse Name: N/A    Number of Children: N/A  . Years of Education: N/A   Occupational History  . Not on file.   Social History Main Topics  . Smoking status: Never Smoker   . Smokeless tobacco: Never Used  . Alcohol Use: No  . Drug Use: No  . Sexually Active: Yes -- Female partner(s)   Other Topics Concern  . Not on file   Social History Narrative  . No narrative on file    Past Surgical History  Procedure Date  . Left knee arthroscopic 2008  . Lumbar stenosis 4039 (53 months of age)    Family History  Problem Relation Age of Onset  . Asthma Mother   . Allergies Mother   . Hyperlipidemia Maternal Grandfather   . Hypertension Maternal Grandfather   . Alcohol abuse Other     Allergies  Allergen Reactions  . Metrogel-Vaginal     Vaginal burning/stinging  . Ropinirole Hydrochloride     REACTION: Chest pain and shortness of breath    Current Outpatient Prescriptions on File Prior to Visit  Medication Sig Dispense Refill  . albuterol (PROVENTIL HFA;VENTOLIN HFA) 108 (90 BASE) MCG/ACT inhaler Inhale 2 puffs into the lungs every 6 (six) hours as needed for wheezing.  1 Inhaler  0  . cetirizine (ZYRTEC)  10 MG tablet Take 10 mg by mouth.        . fluticasone (FLONASE) 50 MCG/ACT nasal spray Place 2 sprays into the nose daily.  16 g  2  . Fluticasone-Salmeterol (ADVAIR DISKUS) 100-50 MCG/DOSE AEPB Inhale 1 puff into the lungs 2 (two) times daily.  1 each  0  . naproxen (NAPROSYN) 500 MG tablet Take 1 tablet (500 mg total) by mouth 2 (two) times daily with a meal.  60 tablet  2  . SUMAtriptan (IMITREX) 50 MG tablet Take 1 tablet (50 mg total) by mouth once as needed for migraine.  15 tablet  0  . valACYclovir (VALTREX) 1000 MG tablet Take 1 tablet (1,000 mg total) by mouth 2 (two) times daily. For cold sores  14 tablet  0    BP 100/68  Pulse 84  Temp(Src) 98.1 F (36.7 C) (Oral)  Resp 16  Ht 4\' 9"  (1.448 m)  Wt 116 lb 1.3 oz (52.654 kg)  BMI 25.12 kg/m2  LMP 07/14/2011       Objective:   Physical Exam    general: Awake, alert, and in no acute distress GYN: A speculum exam was performed today. Cervix was visualized and Mirena string was noted to be exiting the cervical os.  Blood was noted in the vaginal canal. Skin: She is noted to have several pink raised papular lesions on her upper thighs she has one similar lesion above her left knee    Assessment & Plan:

## 2011-07-14 NOTE — Patient Instructions (Signed)
Please call if your rash worsens or does not improve.

## 2011-07-14 NOTE — Assessment & Plan Note (Signed)
This may be due to recent insertion of Mirena and possibly discomfort due to the string. The string is wrapped upwards above the cervix and not currently in view. She has a followup with her GYN scheduled for one month. I encouraged her to keep this appointment and provide reassurance that it appears that her Mirena is properly inserted at this time.

## 2011-07-14 NOTE — Assessment & Plan Note (Signed)
Rashes mild and may be due to recent change in soap. I recommended that she apply betamethasone cream twice daily as needed. She is instructed to contact us if her symptoms worsen or if they do not improve.

## 2011-08-03 ENCOUNTER — Encounter: Payer: Self-pay | Admitting: Family

## 2011-08-05 ENCOUNTER — Ambulatory Visit (INDEPENDENT_AMBULATORY_CARE_PROVIDER_SITE_OTHER): Payer: Managed Care, Other (non HMO) | Admitting: Family Medicine

## 2011-08-05 ENCOUNTER — Encounter: Payer: Self-pay | Admitting: Family Medicine

## 2011-08-05 VITALS — BP 110/74 | HR 81 | Temp 97.8°F | Wt 117.0 lb

## 2011-08-05 DIAGNOSIS — F411 Generalized anxiety disorder: Secondary | ICD-10-CM

## 2011-08-05 DIAGNOSIS — G43909 Migraine, unspecified, not intractable, without status migrainosus: Secondary | ICD-10-CM

## 2011-08-05 DIAGNOSIS — F419 Anxiety disorder, unspecified: Secondary | ICD-10-CM | POA: Insufficient documentation

## 2011-08-05 DIAGNOSIS — R5381 Other malaise: Secondary | ICD-10-CM

## 2011-08-05 DIAGNOSIS — G5600 Carpal tunnel syndrome, unspecified upper limb: Secondary | ICD-10-CM | POA: Insufficient documentation

## 2011-08-05 DIAGNOSIS — R5383 Other fatigue: Secondary | ICD-10-CM

## 2011-08-05 MED ORDER — PROPRANOLOL HCL ER BEADS 80 MG PO CP24: 80.0000 mg | ORAL_CAPSULE | Freq: Every day | ORAL | Status: DC

## 2011-08-05 NOTE — Assessment & Plan Note (Signed)
Suspicious for migraine w/ aura vs basilar migraine. Reassuring exam.  No indication for imaging at this time. Discussed typical migraine triggers and general treatment concepts of avoidance of triggers, abortive meds as early as possible, and possibility of prophylactic meds.  Since she has significant anxiety lately I did go ahead and start innopran XL 80mg  qhs to see if we can get benefit from this regarding both migraine prophylaxis and anxiety.  Therapeutic expectations and side effect profile of medication discussed today.  Patient's questions answered. She will continue ibup or naprox at adequat adult doses prn HA for abortive med.  We may move to triptan if these fail. Offered neuro referral today but they declined at this time. Avoid sleep deprivation and prolonged hunger.

## 2011-08-05 NOTE — Assessment & Plan Note (Signed)
As stated in Migraine HA problem area, started innopran XL and hopefully this will be beneficial for anxiety for her.  Will likely need to add SSRI in near future.

## 2011-08-05 NOTE — Progress Notes (Signed)
OFFICE VISIT  08/05/2011   CC:  Chief Complaint  Patient presents with  . Numbness    in hands, multiple other complaints     HPI:    Patient is a 20 y.o. Caucasian female who presents for headache. Yesterday she was in her usual state of health in the morning, went to school all day and then went home to take a nap before work as usual.  Upon awakening from her nap she felt light-headed but didn't have vertigo or a headache.  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  The dizziness worsened some and she began to feel overwhelmed, had to ask people to repeat themselves often, didn't seem to think clearly, even slurred her words sometimes and stuttered some.  Eventually got so worked up that she felt her heart racing felt presyncopal and had to sit down and take a break.  She felt no significant nausea and had no photophobia.  No hearing deficit or tinnitis.  No chest pain or SOB. No vision changes, no focal weakness, no paresthesias.  She took one ibuprofen tab (OTC) at home last night and felt her HA go away enough to fall asleep.  Woke up this morning after 10+ hrs of sleep with minimal headache and no dizziness.  No further problem with cognitive slowing or dysarthria/stuttering today.  She does c/o both hands being mildly painful in proximal areas and the tips of her fingers feel a little numb.  Has never felt these symptoms before.  No wrist pains.  No hands or fingers swelling or erythema.  No recent meds, no alcohol or drug use, no tobacco. She got mirena inserted approx 48mo ago and has spotted a bit since then and thinks she started her menses today b/c the bleeding is a little heavier. Has mild lower abd cramping as per her usual menses.   No recent URI, ST, cough, rash, joint aches, or fevers.  She is chronically fatigued. She is definitely stressed, increased anxiety over the last year, often  to the point of not feeling like she can cope.  Denies depression. Recently she moved into an apartment with her boyfriend.  She works full time and goes to [employer] full time and loves it.    Past Medical History  Diagnosis Date  . Depression     hx of d/o w/ depression (hospital admission - 2009  . Asthma     exercise- induced    Past Surgical History  Procedure Date  . Left knee arthroscopic 2008  . Lumbar stenosis 4822 (19 months of age)    Outpatient Prescriptions Prior to Visit  Medication Sig Dispense Refill  . albuterol (PROVENTIL HFA;VENTOLIN HFA) 108 (90 BASE) MCG/ACT inhaler Inhale 2 puffs into the lungs every 6 (six) hours as needed for wheezing.  1 Inhaler  0  . betamethasone dipropionate (DIPROLENE) 0.05 % cream Apply topically 2 (two) times daily.  30 g  1  . cetirizine (ZYRTEC) 10 MG tablet Take 10 mg by mouth as needed.       . naproxen (NAPROSYN) 500 MG tablet Take 1 tablet (500 mg total) by mouth 2 (two) times daily with a meal.  60 tablet  2  . SUMAtriptan (IMITREX) 50 MG tablet Take 1 tablet (50 mg total) by mouth once as needed for migraine.  15 tablet  0  . valACYclovir (VALTREX) 1000 MG tablet  Take 1 tablet (1,000 mg total) by mouth 2 (two) times daily. For cold sores  14 tablet  0  . fluticasone (FLONASE) 50 MCG/ACT nasal spray Place 2 sprays into the nose daily.  16 g  2  . Fluticasone-Salmeterol (ADVAIR DISKUS) 100-50 MCG/DOSE AEPB Inhale 1 puff into the lungs 2 (two) times daily.  1 each  0    Allergies  Allergen Reactions  . Metrogel-Vaginal     Vaginal burning/stinging  . Ropinirole Hydrochloride     REACTION: Chest pain and shortness of breath    ROS As per HPI  PE: Blood pressure 110/74, pulse 81, temperature 97.8 F (36.6 C), temperature source Oral, weight 117 lb (53.071 kg), last menstrual period 07/14/2011, SpO2 100.00%. Gen: Alert, well appearing.  Patient is oriented to person, place, time, and situation. HEENT: Scalp  without lesions or hair loss.  Ears: EACs clear, normal epithelium.  TMs with good light reflex and landmarks bilaterally.  Eyes: no injection, icteris, swelling, or exudate.  EOMI, PERRLA. Nose: no drainage or turbinate edema/swelling.  No injection or focal lesion.  Mouth: lips without lesion/swelling.  Oral mucosa pink and moist.  Dentition intact and without obvious caries or gingival swelling.  Oropharynx without erythema, exudate, or swelling.  Neck: supple, ROM full.  Carotids 2+ bilat, without bruit.  No lymphadenopathy, thyromegaly, or mass. Chest: symmetric expansion, nonlabored respirations.  Clear and equal breath sounds in all lung fields.   CV: RRR, no m/r/g.  Peripheral pulses 2+ and symmetric. ABD: soft, NT, ND, BS normal.  No hepatospenomegaly or mass.  No bruits. EXT: no clubbing, cyanosis, or edema.  +Tinel's and +phalen's signs.  No muscle wasting or weakness.  Sensation intact.  No color changes of hands. Skin - no sores or suspicious lesions or rashes or color changes.  No pallor or jaundice. Neuro: CN 2-12 intact bilaterally, strength 5/5 in proximal and distal upper extremities and lower extremities bilaterally.  No sensory deficits.  No tremor.  No disdiadochokinesis.  No ataxia.  Upper extremity and lower extremity DTRs symmetric.  No pronator drift.   LABS:  none  IMPRESSION AND PLAN:  Migraine Suspicious for migraine w/ aura vs basilar migraine. Reassuring exam.  No indication for imaging at this time. Discussed typical migraine triggers and general treatment concepts of avoidance of triggers, abortive meds as early as possible, and possibility of prophylactic meds.  Since she has significant anxiety lately I did go ahead and start innopran XL 80mg  qhs to see if we can get benefit from this regarding both migraine prophylaxis and anxiety.  Therapeutic expectations and side effect profile of medication discussed today.  Patient's questions answered. She will continue  ibup or naprox at adequat adult doses prn HA for abortive med.  We may move to triptan if these fail. Offered neuro referral today but they declined at this time. Avoid sleep deprivation and prolonged hunger.  Carpal tunnel syndrome Start wrist splints during sleep.  These were given/fitted before she left the office today.  Anxiety As stated in Migraine HA problem area, started innopran XL and hopefully this will be beneficial for anxiety for her.  Will likely need to add SSRI in near future.  FATIGUE Likely multifactorial: chronic sleep deprivation, deconditioning, and chronic anxiety. Will check CBC, CMET, and TSH today.   Spent 1 hour with patient today, with >50% of that time spent in counseling and coordinating care regarding her migraine HAs, anxiety, and CTS.  FOLLOW UP: Return in about 1  month (around 09/05/2011) for f/u migraines and anxiety.

## 2011-08-05 NOTE — Assessment & Plan Note (Signed)
Likely multifactorial: chronic sleep deprivation, deconditioning, and chronic anxiety. Will check CBC, CMET, and TSH today.

## 2011-08-05 NOTE — Assessment & Plan Note (Signed)
Start wrist splints during sleep.  These were given/fitted before she left the office today.

## 2011-08-06 ENCOUNTER — Telehealth: Payer: Self-pay | Admitting: Internal Medicine

## 2011-08-06 ENCOUNTER — Encounter: Payer: Self-pay | Admitting: Family Medicine

## 2011-08-06 LAB — CBC WITH DIFFERENTIAL/PLATELET
Basophils Absolute: 0 10*3/uL (ref 0.0–0.1)
Basophils Relative: 0 % (ref 0–1)
Eosinophils Absolute: 0.2 10*3/uL (ref 0.0–0.7)
Eosinophils Relative: 3 % (ref 0–5)
HCT: 37.6 % (ref 36.0–46.0)
Lymphocytes Relative: 36 % (ref 12–46)
MCH: 30.2 pg (ref 26.0–34.0)
MCHC: 34.3 g/dL (ref 30.0–36.0)
MCV: 88.1 fL (ref 78.0–100.0)
Monocytes Absolute: 0.5 10*3/uL (ref 0.1–1.0)
RDW: 13.4 % (ref 11.5–15.5)

## 2011-08-06 LAB — COMPREHENSIVE METABOLIC PANEL
Albumin: 4.5 g/dL (ref 3.5–5.2)
Alkaline Phosphatase: 81 U/L (ref 39–117)
BUN: 13 mg/dL (ref 6–23)
Creat: 0.64 mg/dL (ref 0.50–1.10)
Glucose, Bld: 81 mg/dL (ref 70–99)
Potassium: 4.4 mEq/L (ref 3.5–5.3)

## 2011-08-06 NOTE — Telephone Encounter (Signed)
Please contact pharmacy about Rx sent in yesterday

## 2011-08-09 ENCOUNTER — Other Ambulatory Visit: Payer: Self-pay | Admitting: Family Medicine

## 2011-08-09 ENCOUNTER — Telehealth: Payer: Self-pay

## 2011-08-09 MED ORDER — ALPRAZOLAM 0.5 MG PO TABS: ORAL_TABLET | ORAL | Status: AC

## 2011-08-09 MED ORDER — CITALOPRAM HYDROBROMIDE 20 MG PO TABS: 20.0000 mg | ORAL_TABLET | Freq: Every day | ORAL | Status: DC

## 2011-08-09 NOTE — Telephone Encounter (Signed)
Patient informed and would like to proceed with the new RX along with a few xanax or valium. Pt would like this called into [facility] on [address]. Pt stated she has a follow up scheduled

## 2011-08-09 NOTE — Telephone Encounter (Signed)
Pt left a message stating the medication for her anxiety is not working. Pt stated she is having "bad" panic/ anxiety attacks? Pt would like another med called in? Please advise?

## 2011-08-09 NOTE — Telephone Encounter (Signed)
Ok.  I want her to start citalopram 20mg  once daily (I'll eRx).  Tell her to start taking it around bedtime for the first week or so b/c it might make her a little sleepy at first but this effect will wear off after about a week and she can then start taking it in the morning instead of bedtime if she wants.   Tell her this is a medication that takes a few weeks to start working.  The only thing that helps quickly with anxiety is something like xanax or valium and I would be willing to rx a few of these for extremely anxious days while she's waiting for the other med to start working--but meds like this are NOT intended to be long term treatments for anxiety.  Let me know if she wants this and I'll do rx. Also, keep taking the other med I already rx'd for her (propranolol).   F/u as planned in about 3 wks--PM

## 2011-08-09 NOTE — Telephone Encounter (Signed)
Faxed Xanax to [facility]

## 2011-08-24 ENCOUNTER — Ambulatory Visit (INDEPENDENT_AMBULATORY_CARE_PROVIDER_SITE_OTHER): Payer: Managed Care, Other (non HMO) | Admitting: Family

## 2011-08-24 DIAGNOSIS — H6690 Otitis media, unspecified, unspecified ear: Secondary | ICD-10-CM

## 2011-08-24 DIAGNOSIS — J329 Chronic sinusitis, unspecified: Secondary | ICD-10-CM

## 2011-08-24 MED ORDER — AMOXICILLIN 500 MG PO CAPS: 1000.0000 mg | ORAL_CAPSULE | Freq: Three times a day (TID) | ORAL | Status: AC

## 2011-08-24 NOTE — Progress Notes (Signed)
Subjective:    Patient ID: Megan Sampson, female    DOB: Apr 14, 1991, 20 y.o.   MRN: [id number]  HPI  Megan Sampson is a 20 yr old female who presents today with chief complaint of ear pain.  Symptoms started yesterday afternoon.  Denies difficulty hearing out of the left ear, but notes that loud noises bother that ear.  Denies associated fever.  Notes mild cough for several days which is dry.  She has felt tired.  Notes that she has grinding of her teeth and is due to have her wisdom tooth extracted, but has not gotten around to it. Has some pain with chewing on the left side.  She continues flonase regularly, but is using zyrtec sparingly.  She does report some sinus pressure.   Review of Systems See HPI  Past Medical History  Diagnosis Date  . Depression     hx of d/o w/ depression (hospital admission - 2009  . Asthma     exercise- induced    History   Social History  . Marital Status: Single    Spouse Name: N/A    Number of Children: N/A  . Years of Education: N/A   Occupational History  . Not on file.   Social History Main Topics  . Smoking status: Never Smoker   . Smokeless tobacco: Never Used  . Alcohol Use: No  . Drug Use: No  . Sexually Active: Yes -- Female partner(s)   Other Topics Concern  . Not on file   Social History Narrative  . No narrative on file    Past Surgical History  Procedure Date  . Left knee arthroscopic 2008  . Lumbar stenosis 541 (40 months of age)    Family History  Problem Relation Age of Onset  . Asthma Mother   . Allergies Mother   . Hyperlipidemia Maternal Grandfather   . Hypertension Maternal Grandfather   . Alcohol abuse Other     Allergies  Allergen Reactions  . Metrogel-Vaginal     Vaginal burning/stinging  . Ropinirole Hydrochloride     REACTION: Chest pain and shortness of breath    Current Outpatient Prescriptions on File Prior to Visit  Medication Sig Dispense Refill  . albuterol (PROVENTIL HFA;VENTOLIN HFA)  108 (90 BASE) MCG/ACT inhaler Inhale 2 puffs into the lungs every 6 (six) hours as needed for wheezing.  1 Inhaler  0  . ALPRAZolam (XANAX) 0.5 MG tablet 1-2 tabs po q8 hours as needed for anxiety  30 tablet  0  . betamethasone dipropionate (DIPROLENE) 0.05 % cream Apply topically 2 (two) times daily.  30 g  1  . cetirizine (ZYRTEC) 10 MG tablet Take 10 mg by mouth as needed.       . citalopram (CELEXA) 20 MG tablet Take 1 tablet (20 mg total) by mouth daily.  30 tablet  0  . fluticasone (FLONASE) 50 MCG/ACT nasal spray Place 2 sprays into the nose daily as needed.        . naproxen (NAPROSYN) 500 MG tablet Take 1 tablet (500 mg total) by mouth 2 (two) times daily with a meal.  60 tablet  2  . propranolol (INDERAL LA) 80 MG 24 hr capsule Take 80 mg by mouth daily. Generic propranolol 80 mg extended release (NOT branded med like inderal or innopran)       . SUMAtriptan (IMITREX) 50 MG tablet Take 1 tablet (50 mg total) by mouth once as needed for migraine.  15 tablet  0  . valACYclovir (VALTREX) 1000 MG tablet Take 1 tablet (1,000 mg total) by mouth 2 (two) times daily. For cold sores  14 tablet  0    BP 90/64  Pulse 66  Temp(Src) 98.1 F (36.7 C) (Oral)  Resp 16  Ht 4\' 9"  (1.448 m)  Wt 115 lb 0.6 oz (52.182 kg)  BMI 24.89 kg/m2       Objective:   Physical Exam  Constitutional: She appears well-developed and well-nourished.  HENT:  Head: Normocephalic and atraumatic.  Right Ear: Ear canal normal.  Left Ear: Ear canal normal.       R TM with purulent effusion, left TM with clear effusion.  No bulging or erythema is noted of either ear.    +maxillary sinus tenderness to palpation.  Eyes: Conjunctivae are normal.  Cardiovascular: Normal rate and regular rhythm.   Pulmonary/Chest: Effort normal and breath sounds normal. No respiratory distress. She has no wheezes. She has no rales. She exhibits no tenderness.  Abdominal: Soft. Bowel sounds are normal. She exhibits no distension.    Skin: Skin is warm and dry.  Psychiatric: She has a normal mood and affect. Her behavior is normal. Judgment and thought content normal.          Assessment & Plan:

## 2011-08-24 NOTE — Patient Instructions (Signed)
Restart Zyrtec. Call if symptoms worsen or if you are not feeling better in 48-72 hrs.

## 2011-08-24 NOTE — Assessment & Plan Note (Signed)
Suspect early bilateral otitis media. Plan to treat with amoxicillin.

## 2011-08-24 NOTE — Assessment & Plan Note (Signed)
Will treat with amoxicillin, continue flonase, resume daily zyrtec during the fall season.

## 2011-09-02 NOTE — Telephone Encounter (Signed)
Issue taken care of in 08/09/11 phone note.  Encounter closed.

## 2011-09-06 ENCOUNTER — Ambulatory Visit: Payer: Managed Care, Other (non HMO) | Admitting: Family Medicine

## 2011-09-13 ENCOUNTER — Telehealth: Payer: Self-pay | Admitting: Family Medicine

## 2011-09-13 ENCOUNTER — Encounter: Payer: Self-pay | Admitting: Family Medicine

## 2011-09-13 ENCOUNTER — Ambulatory Visit (INDEPENDENT_AMBULATORY_CARE_PROVIDER_SITE_OTHER): Payer: Managed Care, Other (non HMO) | Admitting: Family Medicine

## 2011-09-13 DIAGNOSIS — F419 Anxiety disorder, unspecified: Secondary | ICD-10-CM

## 2011-09-13 DIAGNOSIS — G4700 Insomnia, unspecified: Secondary | ICD-10-CM

## 2011-09-13 DIAGNOSIS — F411 Generalized anxiety disorder: Secondary | ICD-10-CM

## 2011-09-13 DIAGNOSIS — G5600 Carpal tunnel syndrome, unspecified upper limb: Secondary | ICD-10-CM

## 2011-09-13 MED ORDER — SERTRALINE HCL 25 MG PO TABS: ORAL_TABLET | ORAL | Status: DC

## 2011-09-13 NOTE — Assessment & Plan Note (Signed)
Continue propranolol. Ween off citalopram (1/2 of 20mg  tab qd x 7d, then 1/2 tab qod x 4 doses, then stop). While weening off citalopram, start sertraline 25mg  qhs x 7d, then 50mg  qhs. Therapeutic expectations and side effect profile of medication discussed today.  Patient's questions answered. She has xanax to use prn.  F/u in 3 wks.

## 2011-09-13 NOTE — Telephone Encounter (Signed)
Patient needs a note to excuse her from school for the 85, 21, 22nd faxed [phone]

## 2011-09-13 NOTE — Patient Instructions (Signed)
Start sertraline as instructed on the prescription. Take 1/2 of your citalopram once daily for the next 7d, then take 1/2 dose every other day for 4 doses, then stop citalopram.

## 2011-09-13 NOTE — Assessment & Plan Note (Signed)
MUCH IMPROVED WITH CONSISTENT USE OF WRIST BRACES!

## 2011-09-13 NOTE — Progress Notes (Signed)
OFFICE VISIT  09/13/2011   CC:  Chief Complaint  Patient presents with  . Headache    improved  . Anxiety    Celexa working well, but having side effects     HPI:    Patient is a 20 y.o. Caucasian female who presents for f/u GAD and migraine HA's. Headaches improved, occurring much less frequently.  Has not had to take imitrex. 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 Still going to school all day and working most nights. Has long hx of insomnia that had responded somewhat to some overhaul of her sleep hygiene, but with recent start of citalopram 5 wks ago she notes insomnia is much worse, tosses and turns most of every night.  Denies RLS.  Also says Rozarem used to work on prn basis but doesn't help any now. Citalopram also seems to be causing her bloated/abd discomfort daily, poor appetite, rare nausea.  Citalopram HAS helped significantly with her generalized anxiety, however.  She has not had to use any xanax. She describes distant hx of severe depression with hospitalization in [facility], also panic attacks.  Was on lexapro for a couple of years but eventually d/c'd this b/c she felt emotionally blunted while on it.  Does not feel that way on citalopram. She asks about a trial of zoloft. Says wrists/hands feel 100% better since starting wrist splints nightly for CTS.  Past Medical History  Diagnosis Date  . Depression     hx of d/o w/ depression (hospital admission - 2009  . Asthma     exercise- induced    Past Surgical History  Procedure Date  . Left knee arthroscopic 2008  . Lumbar stenosis 4576 (39 months of age)    Outpatient Prescriptions Prior to Visit  Medication Sig Dispense Refill  . albuterol (PROVENTIL HFA;VENTOLIN HFA) 108 (90 BASE) MCG/ACT inhaler Inhale 2 puffs into the lungs every 6 (six) hours as needed for wheezing.  1 Inhaler  0  . ALPRAZolam (XANAX) 0.5 MG tablet  1-2 tabs po q8 hours as needed for anxiety  30 tablet  0  . amoxicillin (AMOXIL) 500 MG capsule Take 2 capsules (1,000 mg total) by mouth 3 (three) times daily.  60 capsule  0  . betamethasone dipropionate (DIPROLENE) 0.05 % cream Apply topically 2 (two) times daily.  30 g  1  . cetirizine (ZYRTEC) 10 MG tablet Take 10 mg by mouth as needed.       . fluticasone (FLONASE) 50 MCG/ACT nasal spray Place 2 sprays into the nose daily as needed.        . naproxen (NAPROSYN) 500 MG tablet Take 1 tablet (500 mg total) by mouth 2 (two) times daily with a meal.  60 tablet  2  . propranolol (INDERAL LA) 80 MG 24 hr capsule Take 80 mg by mouth daily. Generic propranolol 80 mg extended release (NOT branded med like inderal or innopran)       . SUMAtriptan (IMITREX) 50 MG tablet Take 1 tablet (50 mg total) by mouth once as needed for migraine.  15 tablet  0  . valACYclovir (VALTREX) 1000 MG tablet Take 1 tablet (1,000 mg total) by mouth 2 (two) times daily. For cold sores  14 tablet  0  . citalopram (CELEXA) 20 MG tablet Take 1 tablet (20 mg total) by mouth daily.  30 tablet  0    Allergies  Allergen Reactions  . Metrogel-Vaginal     Vaginal burning/stinging  . Ropinirole Hydrochloride     REACTION: Chest pain and shortness of breath    ROS As per HPI  PE: Blood pressure 110/66, pulse 82, temperature 98.1 F (36.7 C), temperature source Oral, height 4\' 9"  (1.448 m), weight 117 lb (53.071 kg), last menstrual period 07/14/2011. VITALS: Blood pressure 110/66, pulse 82, temperature 98.1 F (36.7 C), temperature source Oral, height 4\' 9"  (1.448 m), weight 117 lb (53.071 kg), last menstrual period 07/14/2011. Wt Readings from Last 2 Encounters:  09/13/11 117 lb (53.071 kg)  08/24/11 115 lb 0.6 oz (52.182 kg)    Gen: alert, oriented x 4, affect pleasant.  Lucid thinking and conversation noted. HEENT: PERRLA, EOMI.   Neck: no LAD, mass, or thyromegaly. CV: RRR, no m/r/g LUNGS: CTA bilat,  nonlabored. NEURO: no tremor or tics noted on observation.  Coordination intact. CN 2-12 grossly intact bilaterally, strength 5/5 in all extremeties.  No ataxia.   LABS:  None today  IMPRESSION AND PLAN:  Anxiety Continue propranolol. Ween off citalopram (1/2 of 20mg  tab qd x 7d, then 1/2 tab qod x 4 doses, then stop). While weening off citalopram, start sertraline 25mg  qhs x 7d, then 50mg  qhs. Therapeutic expectations and side effect profile of medication discussed today.  Patient's questions answered. She has xanax to use prn.  F/u in 3 wks.  Carpal tunnel syndrome MUCH IMPROVED WITH CONSISTENT USE OF WRIST BRACES!  INSOMNIA, CHRONIC With acute worsening due to citalopram. As above, ween off citalopram. May continue prn rozarem.  Starting sertraline.     FOLLOW UP: Return in about 3 weeks (around 10/04/2011) for f/u anxiety.

## 2011-09-13 NOTE — Assessment & Plan Note (Signed)
With acute worsening due to citalopram. As above, ween off citalopram. May continue prn rozarem.  Starting sertraline.

## 2011-09-14 NOTE — Telephone Encounter (Signed)
I have attempted to contact this patient by phone with the following results: left message to return my call on answering machine (home).  

## 2011-09-20 LAB — URINE MICROSCOPIC-ADD ON

## 2011-09-20 LAB — CBC
Hemoglobin: 13.6
MCHC: 34.3
RBC: 4.52

## 2011-09-20 LAB — BASIC METABOLIC PANEL
CO2: 24
Calcium: 9.3
Potassium: 3.9
Sodium: 142

## 2011-09-20 LAB — DIFFERENTIAL
Lymphocytes Relative: 24
Monocytes Absolute: 0.5
Monocytes Relative: 4
Neutro Abs: 7.9

## 2011-09-20 LAB — POCT TOXICOLOGY PANEL

## 2011-09-20 LAB — URINALYSIS, ROUTINE W REFLEX MICROSCOPIC
Glucose, UA: NEGATIVE
Hgb urine dipstick: NEGATIVE
Specific Gravity, Urine: 1.022
pH: 7

## 2011-09-21 LAB — DIFFERENTIAL
Basophils Absolute: 0
Eosinophils Relative: 1
Lymphocytes Relative: 33
Lymphs Abs: 2.2
Neutro Abs: 3.9
Neutrophils Relative %: 58

## 2011-09-21 LAB — DRUGS OF ABUSE SCREEN W/O ALC, ROUTINE URINE
Amphetamine Screen, Ur: NEGATIVE
Marijuana Metabolite: NEGATIVE
Phencyclidine (PCP): NEGATIVE
Propoxyphene: NEGATIVE

## 2011-09-21 LAB — PREGNANCY, URINE: Preg Test, Ur: NEGATIVE

## 2011-09-21 LAB — HEPATIC FUNCTION PANEL
Alkaline Phosphatase: 76
Indirect Bilirubin: 0.5
Total Bilirubin: 0.6
Total Protein: 7.9

## 2011-09-21 LAB — BASIC METABOLIC PANEL
BUN: 11
Calcium: 9.9
Creatinine, Ser: 0.67
Glucose, Bld: 72

## 2011-09-21 LAB — CBC
HCT: 42.4
Platelets: 236
RDW: 12.8
WBC: 6.7

## 2011-09-21 LAB — URINALYSIS, ROUTINE W REFLEX MICROSCOPIC
Bilirubin Urine: NEGATIVE
Ketones, ur: NEGATIVE
Nitrite: NEGATIVE
Specific Gravity, Urine: 1.025
pH: 6

## 2011-09-21 LAB — GC/CHLAMYDIA PROBE AMP, URINE
Chlamydia, Swab/Urine, PCR: NEGATIVE
GC Probe Amp, Urine: NEGATIVE

## 2011-09-21 LAB — URINE MICROSCOPIC-ADD ON

## 2011-09-21 LAB — GAMMA GT: GGT: 10

## 2011-09-21 LAB — T4, FREE: Free T4: 1.27

## 2011-09-29 NOTE — Telephone Encounter (Signed)
No return call from patient.  Follow up ended. 

## 2011-10-04 ENCOUNTER — Ambulatory Visit: Payer: Managed Care, Other (non HMO) | Admitting: Family Medicine

## 2011-10-04 DIAGNOSIS — Z0289 Encounter for other administrative examinations: Secondary | ICD-10-CM

## 2012-11-13 ENCOUNTER — Other Ambulatory Visit (HOSPITAL_BASED_OUTPATIENT_CLINIC_OR_DEPARTMENT_OTHER): Payer: Self-pay | Admitting: Family Medicine

## 2012-11-13 ENCOUNTER — Ambulatory Visit (HOSPITAL_BASED_OUTPATIENT_CLINIC_OR_DEPARTMENT_OTHER)
Admission: RE | Admit: 2012-11-13 | Discharge: 2012-11-13 | Disposition: A | Discharge status: Home / Self Care | Payer: Managed Care, Other (non HMO) | Source: Ambulatory Visit | Attending: Family Medicine | Admitting: Family Medicine

## 2012-11-13 DIAGNOSIS — K5900 Constipation, unspecified: Secondary | ICD-10-CM | POA: Insufficient documentation

## 2012-11-13 DIAGNOSIS — R197 Diarrhea, unspecified: Secondary | ICD-10-CM | POA: Insufficient documentation

## 2012-11-13 DIAGNOSIS — R109 Unspecified abdominal pain: Secondary | ICD-10-CM | POA: Insufficient documentation

## 2012-11-13 DIAGNOSIS — R112 Nausea with vomiting, unspecified: Secondary | ICD-10-CM | POA: Insufficient documentation

## 2012-11-13 MED ORDER — IOHEXOL 300 MG/ML  SOLN
100.0000 mL | Freq: Once | INTRAMUSCULAR | Status: AC | PRN
  Administered 2012-11-13: 100 mL via INTRAVENOUS

## 2012-11-13 MED ORDER — IOHEXOL 300 MG/ML  SOLN
38.0000 mL | Freq: Once | INTRAMUSCULAR | Status: AC | PRN
  Administered 2012-11-13: 38 mL via ORAL

## 2012-11-14 ENCOUNTER — Encounter (HOSPITAL_BASED_OUTPATIENT_CLINIC_OR_DEPARTMENT_OTHER): Payer: Self-pay | Admitting: *Deleted

## 2012-11-14 ENCOUNTER — Emergency Department (HOSPITAL_BASED_OUTPATIENT_CLINIC_OR_DEPARTMENT_OTHER)
Admission: EM | Admit: 2012-11-14 | Discharge: 2012-11-14 | Disposition: A | Discharge status: Home / Self Care | Payer: Managed Care, Other (non HMO) | Attending: Emergency Medicine | Admitting: Emergency Medicine

## 2012-11-14 DIAGNOSIS — J45909 Unspecified asthma, uncomplicated: Secondary | ICD-10-CM | POA: Insufficient documentation

## 2012-11-14 DIAGNOSIS — F329 Major depressive disorder, single episode, unspecified: Secondary | ICD-10-CM | POA: Insufficient documentation

## 2012-11-14 DIAGNOSIS — Z79899 Other long term (current) drug therapy: Secondary | ICD-10-CM | POA: Insufficient documentation

## 2012-11-14 DIAGNOSIS — F3289 Other specified depressive episodes: Secondary | ICD-10-CM | POA: Insufficient documentation

## 2012-11-14 DIAGNOSIS — R51 Headache: Secondary | ICD-10-CM | POA: Insufficient documentation

## 2012-11-14 LAB — URINALYSIS, ROUTINE W REFLEX MICROSCOPIC
Ketones, ur: 15 mg/dL — AB
Leukocytes, UA: NEGATIVE
Nitrite: NEGATIVE
Protein, ur: NEGATIVE mg/dL
pH: 5.5 (ref 5.0–8.0)

## 2012-11-14 LAB — PREGNANCY, URINE: Preg Test, Ur: NEGATIVE

## 2012-11-14 MED ORDER — IBUPROFEN 600 MG PO TABS: 600.0000 mg | ORAL_TABLET | Freq: Four times a day (QID) | ORAL | Status: DC | PRN

## 2012-11-14 MED ORDER — DIPHENHYDRAMINE HCL 50 MG/ML IJ SOLN
12.5000 mg | Freq: Once | INTRAMUSCULAR | Status: AC
  Administered 2012-11-14: 12.5 mg via INTRAVENOUS
  Filled 2012-11-14: qty 1

## 2012-11-14 MED ORDER — KETOROLAC TROMETHAMINE 30 MG/ML IJ SOLN
30.0000 mg | Freq: Once | INTRAMUSCULAR | Status: AC
  Administered 2012-11-14: 30 mg via INTRAVENOUS
  Filled 2012-11-14: qty 1

## 2012-11-14 MED ORDER — SODIUM CHLORIDE 0.9 % IV BOLUS (SEPSIS)
1000.0000 mL | Freq: Once | INTRAVENOUS | Status: AC
  Administered 2012-11-14: 1000 mL via INTRAVENOUS

## 2012-11-14 MED ORDER — METOCLOPRAMIDE HCL 5 MG/ML IJ SOLN
10.0000 mg | Freq: Once | INTRAMUSCULAR | Status: AC
  Administered 2012-11-14: 10 mg via INTRAVENOUS
  Filled 2012-11-14: qty 2

## 2012-11-14 NOTE — ED Notes (Signed)
Pt was seen yesterday for stomach pain and was told to take phenergan by PMD. Pt states she woke this AM with a headache, denies n/v.

## 2012-11-14 NOTE — ED Provider Notes (Signed)
History     CSN: [id number]  Arrival date & time 11/14/12  0244   None     Chief Complaint  Patient presents with  . Headache    (Consider location/radiation/quality/duration/timing/severity/associated sxs/prior treatment) Patient is a 21 y.o. female presenting with headaches. The history is provided by the patient and a parent.  Headache  This is a recurrent problem. The current episode started 1 to 2 hours ago. The problem occurs constantly. The problem has not changed since onset.The headache is associated with nothing. The pain is located in the bilateral region. The quality of the pain is described as throbbing. The pain is severe. The pain does not radiate. Associated symptoms include nausea and vomiting. Pertinent negatives include no fever, no near-syncope, no palpitations and no shortness of breath. Associated symptoms comments: Had 24 hours of nausea vomiting and diarrhea yesterday and given phenergan in PMD office and had CT scan of the abdomen for same.  . She has tried nothing for the symptoms. The treatment provided no relief.  Has had headaches in the past.  No stiff neck.  No changes in vision nor cognition.    Past Medical History  Diagnosis Date  . Depression     hx of d/o w/ depression (hospital admission - 2009  . Asthma     exercise- induced    Past Surgical History  Procedure Date  . Left knee arthroscopic 2008  . Lumbar stenosis 1736 (58 months of age)    Family History  Problem Relation Age of Onset  . Asthma Mother   . Allergies Mother   . Hyperlipidemia Maternal Grandfather   . Hypertension Maternal Grandfather   . Alcohol abuse Other     History  Substance Use Topics  . Smoking status: Never Smoker   . Smokeless tobacco: Never Used  . Alcohol Use: No    OB History    Grav Para Term Preterm Abortions TAB SAB Ect Mult Living                  Review of Systems  Constitutional: Negative for fever.  HENT: Negative for ear pain, sore  throat, trouble swallowing, neck pain, neck stiffness and voice change.   Respiratory: Negative for shortness of breath.   Cardiovascular: Negative for palpitations and near-syncope.  Gastrointestinal: Positive for nausea, vomiting and diarrhea.  Neurological: Positive for headaches.  All other systems reviewed and are negative.    Allergies  Metronidazole and Ropinirole hydrochloride  Home Medications   Current Outpatient Rx  Name  Route  Sig  Dispense  Refill  . CETIRIZINE HCL 10 MG PO TABS   Oral   Take 10 mg by mouth as needed.          . ALBUTEROL SULFATE HFA 108 (90 BASE) MCG/ACT IN AERS   Inhalation   Inhale 2 puffs into the lungs every 6 (six) hours as needed for wheezing.   1 Inhaler   0   . FLUTICASONE PROPIONATE 50 MCG/ACT NA SUSP   Nasal   Place 2 sprays into the nose daily as needed.           Marland Kitchen PROPRANOLOL HCL 80 MG PO CP24   Oral   Take 80 mg by mouth daily. Generic propranolol 80 mg extended release (NOT branded med like inderal or innopran)          . RAMELTEON 8 MG PO TABS      Take 1/2 tablet by mouth at bedtime as  needed          . SERTRALINE HCL 25 MG PO TABS      1 tab po qd x 7d, then 2 tabs po qd   60 tablet   0   . SUMATRIPTAN SUCCINATE 50 MG PO TABS   Oral   Take 1 tablet (50 mg total) by mouth once as needed for migraine.   15 tablet   0     BP 127/67  Pulse 98  Temp 99.1 F (37.3 C) (Oral)  Resp 18  Ht 4\' 11"  (1.499 m)  Wt 140 lb (63.504 kg)  BMI 28.28 kg/m2  SpO2 100%  LMP 11/06/2012  Physical Exam  Constitutional: She is oriented to person, place, and time. She appears well-developed and well-nourished.       Resting comfortably in room   HENT:  Head: Normocephalic and atraumatic.  Right Ear: No mastoid tenderness. Tympanic membrane is not injected.  Left Ear: No mastoid tenderness. Tympanic membrane is not injected.  Mouth/Throat: Oropharynx is clear and moist. No oropharyngeal exudate.  Eyes: Conjunctivae  normal and EOM are normal. Pupils are equal, round, and reactive to light.  Neck: Normal range of motion. Neck supple. No tracheal deviation present.       No meningeal signs  Cardiovascular: Normal rate and regular rhythm.   Pulmonary/Chest: Effort normal and breath sounds normal. She has no wheezes. She has no rales.  Abdominal: Soft. Bowel sounds are normal. There is no tenderness. There is no rebound and no guarding.  Musculoskeletal: Normal range of motion.  Lymphadenopathy:    She has no cervical adenopathy.  Neurological: She is alert and oriented to person, place, and time. No cranial nerve deficit.  Skin: Skin is warm and dry.  Psychiatric: She has a normal mood and affect.    ED Course  Procedures (including critical care time)  Labs Reviewed - No data to display Ct Abdomen Pelvis W Contrast  11/13/2012  *RADIOLOGY REPORT*  Clinical Data: Mid abdominal pain with severe nausea and vomiting. Constipation and diarrhea.  CT ABDOMEN AND PELVIS WITH CONTRAST  Technique:  Multidetector CT imaging of the abdomen and pelvis was performed following the standard protocol during bolus administration of intravenous contrast.  Contrast: OMNIPAQUE IOHEXOL 300 MG/ML  SOLN  Comparison: None.  Findings: Lung bases show no acute findings.  Heart size normal. No pericardial or pleural effusion.  Liver, gallbladder, adrenal glands, kidneys, spleen, pancreas, stomach and bowel, including appendix, are unremarkable.  Ileocolic mesenteric lymph nodes are not enlarged by CT size criteria. Intrauterine contraceptive device is in place.  Ovaries are visualized.  No free fluid.  No worrisome lytic or sclerotic lesions.  IMPRESSION: Negative.   Original Report Authenticated By: Leanna Battles, M.D.      No diagnosis found.    MDM  Reexamined prior to d/c.  Cn 2-12 intact.  Headache with a h/o migraines following vomiting and diarrhea yesterday.  No indication for imaging at this time.  No indication  no LP.  Return for fevers, stiff neck, changes in vision or speech or any concerns.  Follow up with your family doctor this week for recheck, patient and mother verbalize understanding and agree to follo     Lucendia Leard Smitty Cords, MD 11/14/12 1610

## 2013-03-12 IMAGING — CR DG CHEST 2V
2 series · 2 of 2 positions shown · non-contrast
Comparison: None.

CLINICAL DATA: Chest pain.  Cough.  Congestion.  Headache and
fever.

CHEST - 2 VIEW

[w chest pa]
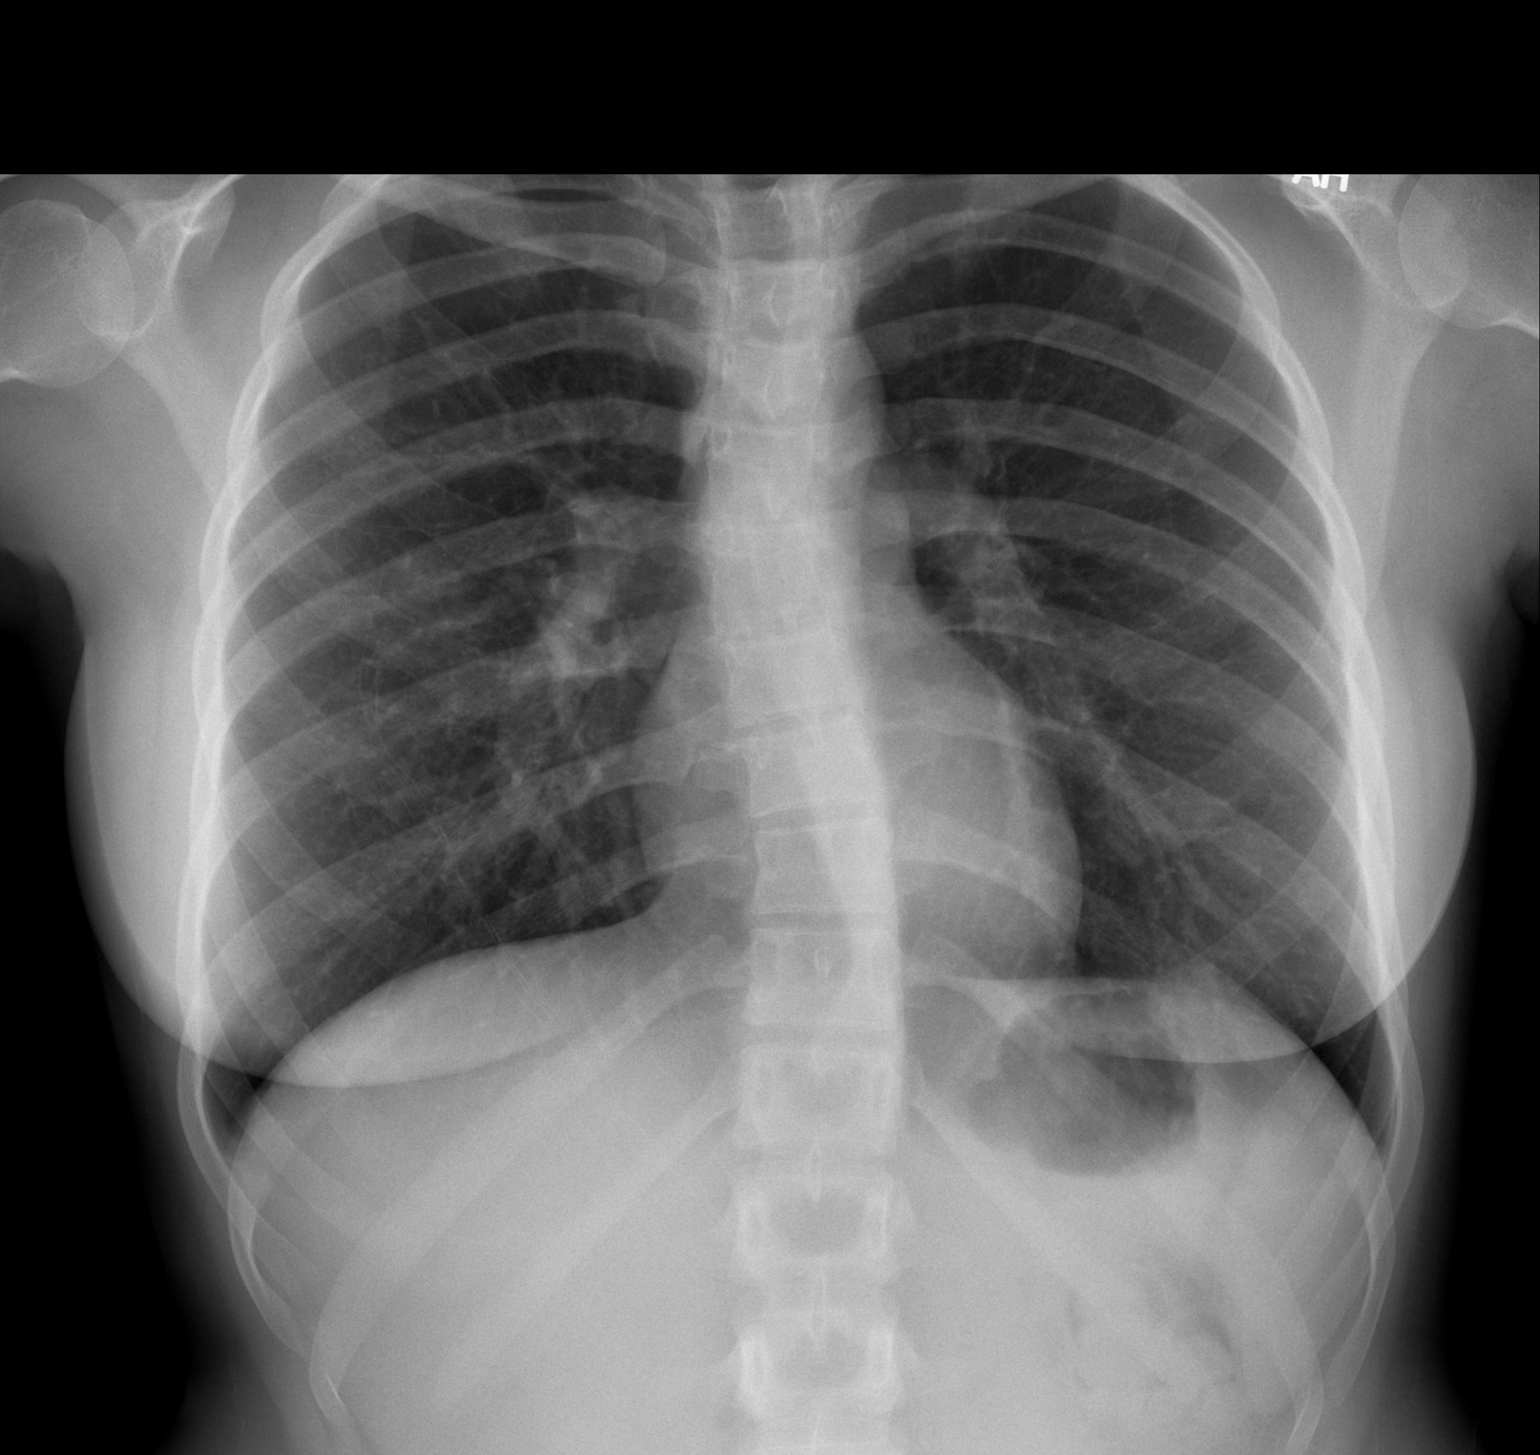

[w chest lat]
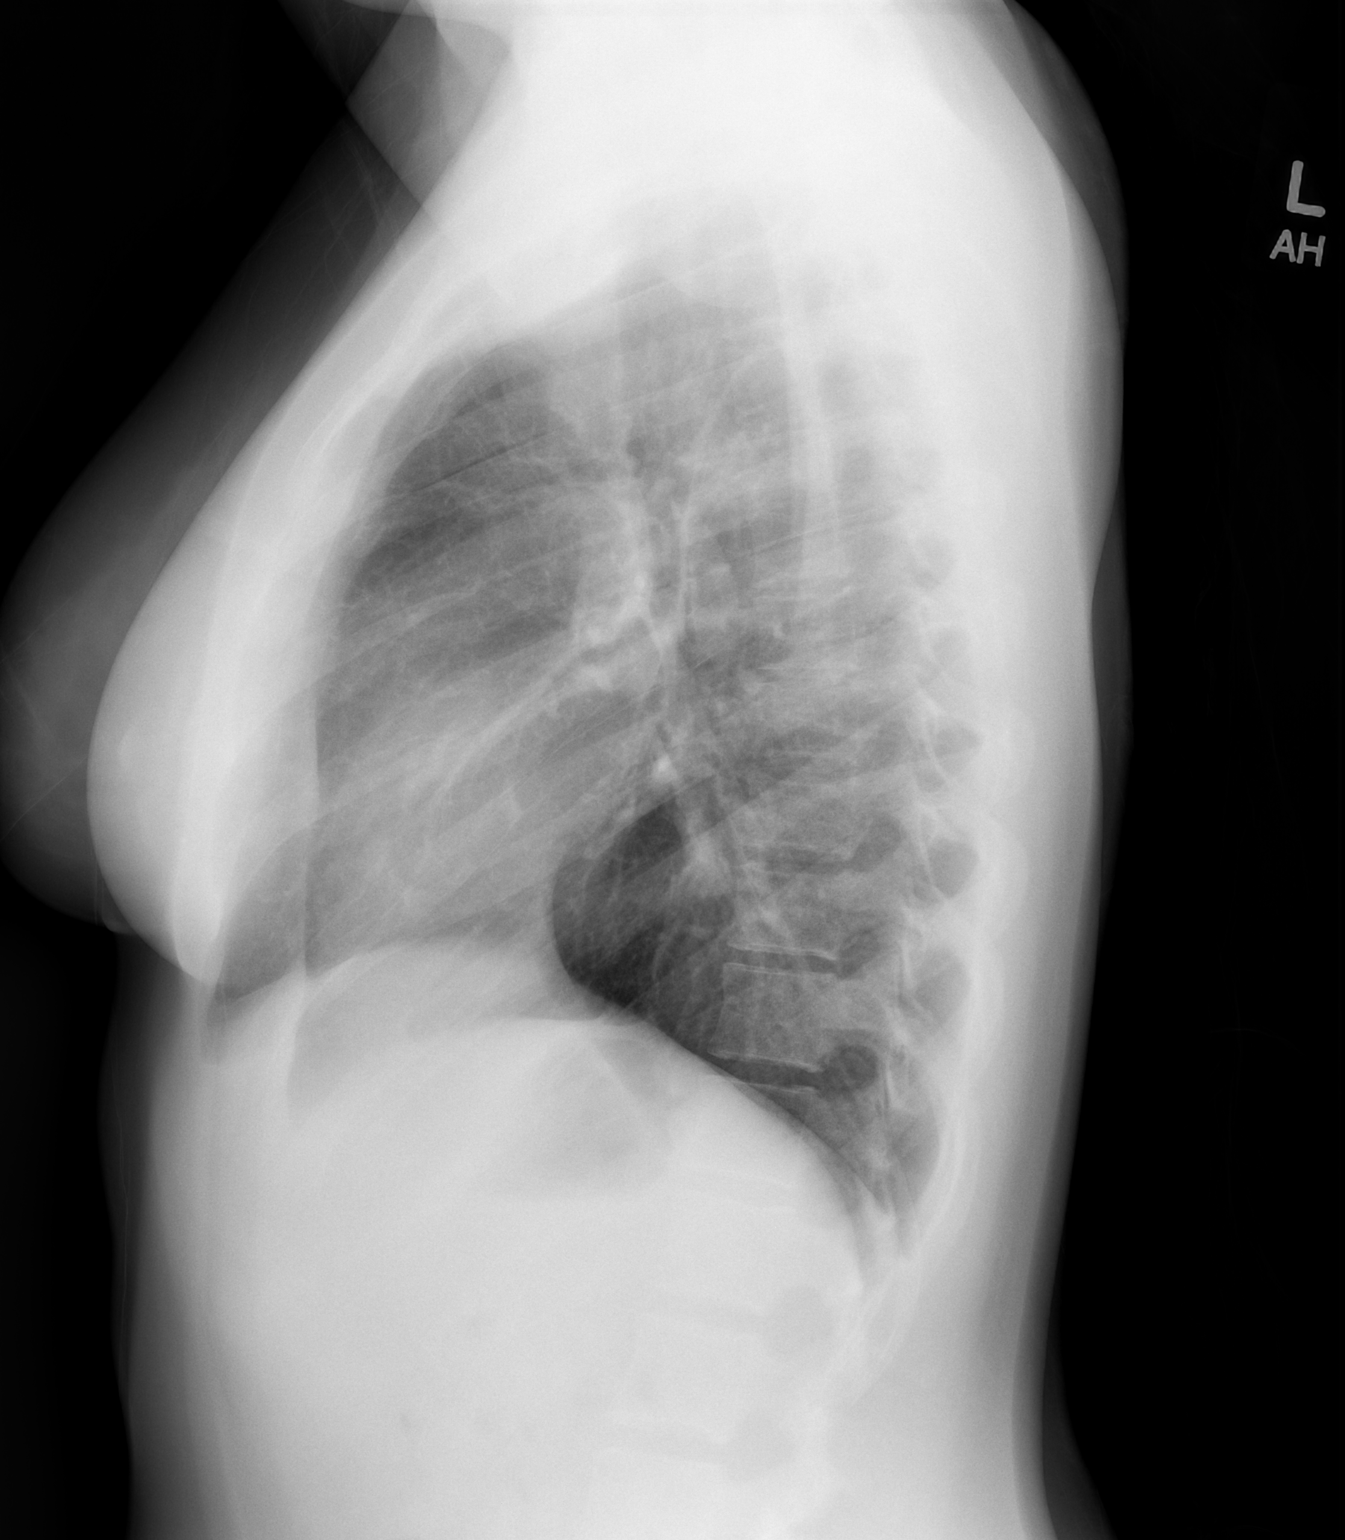

[2 of 2 positions shown; findings below may reference images not displayed]

FINDINGS: The heart size is normal.  The lungs are clear.  Mild
curvature of the thoracic spine is noted.  The visualized soft
tissues and bony thorax are otherwise unremarkable.
IMPRESSION: 1.  No acute cardiopulmonary disease.
2.  Mild scoliosis of the thoracic spine.

## 2013-07-01 ENCOUNTER — Emergency Department (HOSPITAL_BASED_OUTPATIENT_CLINIC_OR_DEPARTMENT_OTHER)
Admission: EM | Admit: 2013-07-01 | Discharge: 2013-07-01 | Disposition: A | Discharge status: Home / Self Care | Payer: Managed Care, Other (non HMO) | Attending: Emergency Medicine | Admitting: Emergency Medicine

## 2013-07-01 ENCOUNTER — Encounter (HOSPITAL_BASED_OUTPATIENT_CLINIC_OR_DEPARTMENT_OTHER): Payer: Self-pay | Admitting: Emergency Medicine

## 2013-07-01 DIAGNOSIS — W64XXXA Exposure to other animate mechanical forces, initial encounter: Secondary | ICD-10-CM | POA: Insufficient documentation

## 2013-07-01 DIAGNOSIS — Y929 Unspecified place or not applicable: Secondary | ICD-10-CM | POA: Insufficient documentation

## 2013-07-01 DIAGNOSIS — S058X9A Other injuries of unspecified eye and orbit, initial encounter: Secondary | ICD-10-CM | POA: Insufficient documentation

## 2013-07-01 DIAGNOSIS — Z8659 Personal history of other mental and behavioral disorders: Secondary | ICD-10-CM | POA: Insufficient documentation

## 2013-07-01 DIAGNOSIS — J45909 Unspecified asthma, uncomplicated: Secondary | ICD-10-CM | POA: Insufficient documentation

## 2013-07-01 DIAGNOSIS — S0501XA Injury of conjunctiva and corneal abrasion without foreign body, right eye, initial encounter: Secondary | ICD-10-CM

## 2013-07-01 DIAGNOSIS — Y9389 Activity, other specified: Secondary | ICD-10-CM | POA: Insufficient documentation

## 2013-07-01 MED ORDER — FLUORESCEIN SODIUM 1 MG OP STRP
ORAL_STRIP | OPHTHALMIC | Status: AC
  Administered 2013-07-01: 20:00:00
  Filled 2013-07-01: qty 1

## 2013-07-01 MED ORDER — TOBRAMYCIN 0.3 % OP SOLN: 1.0000 [drp] | OPHTHALMIC | Status: DC

## 2013-07-01 MED ORDER — TETRACAINE HCL 0.5 % OP SOLN
OPHTHALMIC | Status: AC
  Administered 2013-07-01: 20:00:00
  Filled 2013-07-01: qty 2

## 2013-07-01 MED ORDER — HYDROCODONE-ACETAMINOPHEN 5-325 MG PO TABS: 2.0000 | ORAL_TABLET | ORAL | Status: DC | PRN

## 2013-07-01 NOTE — ED Notes (Signed)
Pt c/o RT eye pain after her dog scratched it today

## 2013-07-01 NOTE — ED Notes (Signed)
Pt verbalized understanding of discharge instructions.

## 2013-07-01 NOTE — ED Provider Notes (Signed)
Medical screening examination/treatment/procedure(s) were performed by non-physician practitioner and as supervising physician I was immediately available for consultation/collaboration.   Glynn Octave, MD 07/01/13 2322

## 2013-07-01 NOTE — ED Provider Notes (Signed)
History    CSN: [id number] Arrival date & time 07/01/13  1648  First MD Initiated Contact with Patient 07/01/13 1841     Chief Complaint  Patient presents with  . Eye Pain   (Consider location/radiation/quality/duration/timing/severity/associated sxs/prior Treatment) Patient is a 22 y.o. female presenting with eye pain. The history is provided by the patient. No language interpreter was used.  Eye Pain This is a new problem. The current episode started today. The problem occurs constantly. Nothing aggravates the symptoms. She has tried nothing for the symptoms. The treatment provided no relief.  Pt reports her dog scratched her eye Past Medical History  Diagnosis Date  . Depression     hx of d/o w/ depression (hospital admission - 2009  . Asthma     exercise- induced   Past Surgical History  Procedure Laterality Date  . Left knee arthroscopic  2008  . Lumbar stenosis  3329 (11 months of age)  . Knee arthroscopy Right    Family History  Problem Relation Age of Onset  . Asthma Mother   . Allergies Mother   . Hyperlipidemia Maternal Grandfather   . Hypertension Maternal Grandfather   . Alcohol abuse Other    History  Substance Use Topics  . Smoking status: Never Smoker   . Smokeless tobacco: Never Used  . Alcohol Use: No   OB History   Grav Para Term Preterm Abortions TAB SAB Ect Mult Living                 Review of Systems  Eyes: Positive for pain.  All other systems reviewed and are negative.    Allergies  Metronidazole and Ropinirole hydrochloride  Home Medications   Current Outpatient Rx  Name  Route  Sig  Dispense  Refill  . amphetamine-dextroamphetamine (ADDERALL) 10 MG tablet   Oral   Take 10 mg by mouth 3 (three) times daily.         Marland Kitchen EXPIRED: albuterol (PROVENTIL HFA;VENTOLIN HFA) 108 (90 BASE) MCG/ACT inhaler   Inhalation   Inhale 2 puffs into the lungs every 6 (six) hours as needed for wheezing.   1 Inhaler   0   . cetirizine  (ZYRTEC) 10 MG tablet   Oral   Take 10 mg by mouth as needed.          Marland Kitchen EXPIRED: fluticasone (FLONASE) 50 MCG/ACT nasal spray   Nasal   Place 2 sprays into the nose daily as needed.           Marland Kitchen ibuprofen (ADVIL,MOTRIN) 600 MG tablet   Oral   Take 1 tablet (600 mg total) by mouth every 6 (six) hours as needed for pain.   20 tablet   0   . propranolol (INDERAL LA) 80 MG 24 hr capsule   Oral   Take 80 mg by mouth daily. Generic propranolol 80 mg extended release (NOT branded med like inderal or innopran)          . ramelteon (ROZEREM) 8 MG tablet      Take 1/2 tablet by mouth at bedtime as needed          . sertraline (ZOLOFT) 25 MG tablet      1 tab po qd x 7d, then 2 tabs po qd   60 tablet   0   . EXPIRED: SUMAtriptan (IMITREX) 50 MG tablet   Oral   Take 1 tablet (50 mg total) by mouth once as needed for migraine.  15 tablet   0    BP 113/59  Pulse 72  Temp(Src) 98.3 F (36.8 C) (Oral)  Resp 16  SpO2 100%  LMP 06/04/2013 Physical Exam  Nursing note and vitals reviewed. Constitutional: She appears well-developed and well-nourished.  HENT:  Head: Normocephalic.  Eyes: EOM and lids are normal. Pupils are equal, round, and reactive to light. Right conjunctiva is injected.  Corneal abrasion center  fluro uptake  Neck: Normal range of motion.  Musculoskeletal: Normal range of motion.  Neurological: She is alert.  Skin: Skin is warm.  Psychiatric: She has a normal mood and affect.    ED Course  Procedures (including critical care time) Labs Reviewed - No data to display No results found. No diagnosis found.  MDM  tobrex opth solution, hydrocodone,  Follow up with eye doctor for recheck in 2-3 days if symptoms do not completely resolve  Elson Areas, PA-C 07/01/13 1915

## 2014-03-04 ENCOUNTER — Emergency Department (HOSPITAL_COMMUNITY)
Admission: EM | Admit: 2014-03-04 | Discharge: 2014-03-04 | Disposition: A | Discharge status: Home / Self Care | Payer: Managed Care, Other (non HMO) | Attending: Emergency Medicine | Admitting: Emergency Medicine

## 2014-03-04 ENCOUNTER — Encounter (HOSPITAL_COMMUNITY): Payer: Self-pay | Admitting: Emergency Medicine

## 2014-03-04 DIAGNOSIS — T887XXA Unspecified adverse effect of drug or medicament, initial encounter: Secondary | ICD-10-CM

## 2014-03-04 DIAGNOSIS — T43695A Adverse effect of other psychostimulants, initial encounter: Secondary | ICD-10-CM | POA: Insufficient documentation

## 2014-03-04 DIAGNOSIS — Z79899 Other long term (current) drug therapy: Secondary | ICD-10-CM | POA: Insufficient documentation

## 2014-03-04 DIAGNOSIS — G252 Other specified forms of tremor: Secondary | ICD-10-CM

## 2014-03-04 DIAGNOSIS — G4730 Sleep apnea, unspecified: Secondary | ICD-10-CM | POA: Insufficient documentation

## 2014-03-04 DIAGNOSIS — G250 Essential tremor: Secondary | ICD-10-CM | POA: Insufficient documentation

## 2014-03-04 DIAGNOSIS — F410 Panic disorder [episodic paroxysmal anxiety] without agoraphobia: Secondary | ICD-10-CM | POA: Insufficient documentation

## 2014-03-04 DIAGNOSIS — R002 Palpitations: Secondary | ICD-10-CM | POA: Insufficient documentation

## 2014-03-04 DIAGNOSIS — Z3202 Encounter for pregnancy test, result negative: Secondary | ICD-10-CM | POA: Insufficient documentation

## 2014-03-04 DIAGNOSIS — F419 Anxiety disorder, unspecified: Secondary | ICD-10-CM

## 2014-03-04 DIAGNOSIS — F329 Major depressive disorder, single episode, unspecified: Secondary | ICD-10-CM | POA: Insufficient documentation

## 2014-03-04 DIAGNOSIS — F3289 Other specified depressive episodes: Secondary | ICD-10-CM | POA: Insufficient documentation

## 2014-03-04 DIAGNOSIS — F1210 Cannabis abuse, uncomplicated: Secondary | ICD-10-CM | POA: Insufficient documentation

## 2014-03-04 DIAGNOSIS — F17200 Nicotine dependence, unspecified, uncomplicated: Secondary | ICD-10-CM | POA: Insufficient documentation

## 2014-03-04 DIAGNOSIS — J45909 Unspecified asthma, uncomplicated: Secondary | ICD-10-CM | POA: Insufficient documentation

## 2014-03-04 HISTORY — DX: Anxiety disorder, unspecified: F41.9

## 2014-03-04 HISTORY — DX: Panic disorder (episodic paroxysmal anxiety): F41.0

## 2014-03-04 LAB — URINALYSIS, ROUTINE W REFLEX MICROSCOPIC
BILIRUBIN URINE: NEGATIVE
Glucose, UA: NEGATIVE mg/dL
Hgb urine dipstick: NEGATIVE
Ketones, ur: NEGATIVE mg/dL
Nitrite: NEGATIVE
PH: 7.5 (ref 5.0–8.0)
Protein, ur: NEGATIVE mg/dL
Specific Gravity, Urine: 1.03 (ref 1.005–1.030)
Urobilinogen, UA: 1 mg/dL (ref 0.0–1.0)

## 2014-03-04 LAB — BASIC METABOLIC PANEL
BUN: 9 mg/dL (ref 6–23)
CHLORIDE: 107 meq/L (ref 96–112)
CO2: 25 mEq/L (ref 19–32)
CREATININE: 0.54 mg/dL (ref 0.50–1.10)
Calcium: 8.5 mg/dL (ref 8.4–10.5)
GFR calc Af Amer: >90 mL/min (ref >90)
GFR calc non Af Amer: >90 mL/min (ref >90)
Glucose, Bld: 101 mg/dL — ABNORMAL HIGH (ref 70–99)
Potassium: 4 mEq/L (ref 3.7–5.3)
Sodium: 142 mEq/L (ref 137–147)

## 2014-03-04 LAB — CBC WITH DIFFERENTIAL/PLATELET
BASOS PCT: 0 % (ref 0–1)
Basophils Absolute: 0 10*3/uL (ref 0.0–0.1)
Eosinophils Absolute: 0.1 10*3/uL (ref 0.0–0.7)
Eosinophils Relative: 2 % (ref 0–5)
HEMATOCRIT: 33.9 % — AB (ref 36.0–46.0)
HEMOGLOBIN: 12 g/dL (ref 12.0–15.0)
Lymphocytes Relative: 32 % (ref 12–46)
Lymphs Abs: 2.5 10*3/uL (ref 0.7–4.0)
MCH: 30.3 pg (ref 26.0–34.0)
MCHC: 35.4 g/dL (ref 30.0–36.0)
MCV: 85.6 fL (ref 78.0–100.0)
MONO ABS: 0.8 10*3/uL (ref 0.1–1.0)
MONOS PCT: 10 % (ref 3–12)
Neutro Abs: 4.4 10*3/uL (ref 1.7–7.7)
Neutrophils Relative %: 56 % (ref 43–77)
Platelets: 227 10*3/uL (ref 150–400)
RBC: 3.96 MIL/uL (ref 3.87–5.11)
RDW: 12.8 % (ref 11.5–15.5)
WBC: 7.8 10*3/uL (ref 4.0–10.5)

## 2014-03-04 LAB — URINE MICROSCOPIC-ADD ON

## 2014-03-04 LAB — RAPID URINE DRUG SCREEN, HOSP PERFORMED
Amphetamines: POSITIVE — AB
BENZODIAZEPINES: NOT DETECTED
Barbiturates: NOT DETECTED
Cocaine: NOT DETECTED
Opiates: NOT DETECTED
TETRAHYDROCANNABINOL: POSITIVE — AB

## 2014-03-04 LAB — POC URINE PREG, ED: Preg Test, Ur: NEGATIVE

## 2014-03-04 MED ORDER — ZOLPIDEM TARTRATE 5 MG PO TABS: 5.0000 mg | ORAL_TABLET | Freq: Every evening | ORAL | Status: AC | PRN

## 2014-03-04 NOTE — ED Notes (Signed)
PT here for c.o feeling like her heart was racing. Pt has implant birth control, reports she smokes a few cigarettes per day, also smoked marijuana tonight. PT reports history of anxiety and panic attacks, states her last panic attack was two weeks ago. Pt states she also feels dizzy and cannot sleep. Denies chest pain but reports a pressure in her chest.

## 2014-03-04 NOTE — ED Provider Notes (Signed)
CSN: [id number]     Arrival date & time 03/04/14  0227 History   First MD Initiated Contact with Patient 03/04/14 [phone]     Chief Complaint  Patient presents with  . Anxiety     (Consider location/radiation/quality/duration/timing/severity/associated sxs/prior Treatment) HPI Comments: Pt with hx of ADHD comes in with of anxiety. Pt states that she woke up this morning, and felt like she hasn't slept at all. 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 She also felt liker her heart was racing early on and having palpitations.ROs is also + for about 40 lb weight loss since she started adderall. No night sweats, no appetite change. She denies any rash, arthralgias, thyroid problems. + marijuana use.  Patient is a 23 y.o. female presenting with anxiety. The history is provided by the patient.  Anxiety Pertinent negatives include no chest pain, no abdominal pain, no headaches and no shortness of breath.    Past Medical History  Diagnosis Date  . Depression     hx of d/o w/ depression (hospital admission - 2009  . Asthma     exercise- induced  . Anxiety   . Panic attack    Past Surgical History  Procedure Laterality Date  . Left knee arthroscopic  2008  . Lumbar stenosis  [id number] (616 months of age)  . Knee arthroscopy Right    Family History  Problem Relation Age of Onset  . Asthma Mother   . Allergies Mother   . Hyperlipidemia Maternal Grandfather   . Hypertension Maternal Grandfather   . Alcohol abuse Other    History  Substance Use Topics  . Smoking status: Current Every Day Smoker -- 0.50 packs/day for 1 years    Types: Cigarettes  . Smokeless tobacco: Never Used  . Alcohol Use: Yes     Comment: occassional    OB History   Grav Para Term Preterm Abortions TAB SAB Ect Mult Living                 Review of Systems  Constitutional: Negative for activity change.  Respiratory: Negative for shortness of breath.    Cardiovascular: Negative for chest pain.  Gastrointestinal: Negative for nausea, vomiting and abdominal pain.  Genitourinary: Negative for dysuria.  Musculoskeletal: Negative for neck pain.  Neurological: Positive for tremors. Negative for syncope, speech difficulty, weakness, numbness and headaches.  Psychiatric/Behavioral: Positive for sleep disturbance. Negative for hallucinations. The patient is nervous/anxious.       Allergies  Metronidazole and Ropinirole hydrochloride  Home Medications   Current Outpatient Rx  Name  Route  Sig  Dispense  Refill  . albuterol (PROVENTIL HFA;VENTOLIN HFA) 108 (90 BASE) MCG/ACT inhaler   Inhalation   Inhale 1 puff into the lungs every 6 (six) hours as needed for wheezing or shortness of breath.         . ALPRAZolam (XANAX) 0.5 MG tablet   Oral   Take 0.5 mg by mouth 2 (two) times daily as needed for anxiety.         Marland Kitchen. amphetamine-dextroamphetamine (ADDERALL) 10 MG tablet   Oral   Take 10 mg by mouth 3 (three) times daily.         . cyclobenzaprine (FLEXERIL) 10 MG tablet   Oral   Take 10 mg by mouth 3 (three) times daily as needed for muscle spasms.          BP 102/65  Pulse 71  Temp(Src) 97.9 F (36.6  C) (Oral)  Resp 17  Ht 4\' 10"  (1.473 m)  Wt 107 lb (48.535 kg)  BMI 22.37 kg/m2  SpO2 100%  LMP 02/16/2014 Physical Exam  Nursing note and vitals reviewed. Constitutional: She is oriented to person, place, and time. She appears well-developed and well-nourished.  HENT:  Head: Normocephalic and atraumatic.  Eyes: EOM are normal. Pupils are equal, round, and reactive to light.  Neck: Neck supple.  Cardiovascular: Normal rate, regular rhythm and normal heart sounds.   No murmur heard. Pulmonary/Chest: Effort normal. No respiratory distress.  Abdominal: Soft. She exhibits no distension. There is no tenderness. There is no rebound and no guarding.  Neurological: She is alert and oriented to person, place, and time.  Skin:  Skin is warm and dry.  Psychiatric: She has a normal mood and affect. Her behavior is normal. Judgment and thought content normal.    ED Course  Procedures (including critical care time) Labs Review Labs Reviewed  CBC WITH DIFFERENTIAL  BASIC METABOLIC PANEL  URINALYSIS, ROUTINE W REFLEX MICROSCOPIC  URINE RAPID DRUG SCREEN (HOSP PERFORMED)  POC URINE PREG, ED   Imaging Review No results found.   EKG Interpretation None      MDM   Final diagnoses:  None    Pt comes in with non specific complains.  Some anxiety like sx, with tremors and feeling tired. + palpitations. + marijuana use, occasionally, and weight loss.  Will check basic lytes. She is young and healthy otherwise. Suspect side effects of adderall. She deneis any other stimulant use.   Derwood Kaplan, MD 03/04/14 [phone]

## 2014-03-04 NOTE — ED Notes (Signed)
Pt c/o feeling that her heart is racing, unable to sleep and feeling dizzy/dehydrated. Pt states she has a history of panic attacks and anxiety.

## 2014-03-04 NOTE — Discharge Instructions (Signed)
We saw you in the ER for the tremors, anxiety, palpations. All the results in the ER are normal, labs and imaging. We are not sure what is causing your symptoms BUT I THINK IT IS LIKELY SIDE EFFECTS OF YOUR MEDICINE. The workup in the ER is not complete, and is limited to screening for life threatening and emergent conditions only, so please see a primary care doctor for further evaluation.  SIDE EFFECTS OF ADDERALL INCLUDE: Side effects of Adderall include nervousness, restlessness, excitability, dizziness, headache, fear, anxiety, and tremor. Blood pressure and heart rate may increase, and patients may experience palpitations of the heart. Adderall is habit forming and chronic use may lead to dependence.   Tremor Tremor is a rhythmic, involuntary muscular contraction characterized by oscillations (to-and-fro movements) of a part of the body. The most common of all involuntary movements, tremor can affect various body parts such as the hands, head, facial structures, vocal cords, trunk, and legs; most tremors, however, occur in the hands. Tremor often accompanies neurological disorders associated with aging. Although the disorder is not life-threatening, it can be responsible for functional disability and social embarrassment. TREATMENT  There are many types of tremor and several ways in which tremor is classified. The most common classification is by behavioral context or position. There are five categories of tremor within this classification: resting, postural, kinetic, task-specific, and psychogenic. Resting or static tremor occurs when the muscle is at rest, for example when the hands are lying on the lap. This type of tremor is often seen in patients with Parkinson's disease. Postural tremor occurs when a patient attempts to maintain posture, such as holding the hands outstretched. Postural tremors include physiological tremor, essential tremor, tremor with basal ganglia disease (also seen in  patients with Parkinson's disease), cerebellar postural tremor, tremor with peripheral neuropathy, post-traumatic tremor, and alcoholic tremor. Kinetic or intention (action) tremor occurs during purposeful movement, for example during finger-to-nose testing. Task-specific tremor appears when performing goal-oriented tasks such as handwriting, speaking, or standing. This group consists of primary writing tremor, vocal tremor, and orthostatic tremor. Psychogenic tremor occurs in both older and younger patients. The key feature of this tremor is that it dramatically lessens or disappears when the patient is distracted. PROGNOSIS There are some treatment options available for tremor; the appropriate treatment depends on accurate diagnosis of the cause. Some tremors respond to treatment of the underlying condition, for example in some cases of psychogenic tremor treating the patient's underlying mental problem may cause the tremor to disappear. Also, patients with tremor due to Parkinson's disease may be treated with Levodopa drug therapy. Symptomatic drug therapy is available for several other tremors as well. For those cases of tremor in which there is no effective drug treatment, physical measures such as teaching the patient to brace the affected limb during the tremor are sometimes useful. Surgical intervention such as thalamotomy or deep brain stimulation may be useful in certain cases. Document Released: 11/26/2002 Document Revised: 02/28/2012 Document Reviewed: 12/06/2005 [facility] Patient Information 2014 [place], [place]. Generalized Anxiety Disorder Generalized anxiety disorder (GAD) is a mental disorder. It interferes with life functions, including relationships, work, and school. GAD is different from normal anxiety, which everyone experiences at some point in their lives in response to specific life events and activities. Normal anxiety actually helps us prepare for and get through these life events  and activities. Normal anxiety goes away after the event or activity is over.  GAD causes anxiety that is not necessarily related to specific  events or activities. It also causes excess anxiety in proportion to specific events or activities. The anxiety associated with GAD is also difficult to control. GAD can vary from mild to severe. People with severe GAD can have intense waves of anxiety with physical symptoms (panic attacks).  SYMPTOMS The anxiety and worry associated with GAD are difficult to control. This anxiety and worry are related to many life events and activities and also occur more days than not for 6 months or longer. People with GAD also have three or more of the following symptoms (one or more in children):  Restlessness.   Fatigue.  Difficulty concentrating.   Irritability.  Muscle tension.  Difficulty sleeping or unsatisfying sleep. DIAGNOSIS GAD is diagnosed through an assessment by your caregiver. Your caregiver will ask you questions aboutyour mood,physical symptoms, and events in your life. Your caregiver may ask you about your medical history and use of alcohol or drugs, including prescription medications. Your caregiver may also do a physical exam and blood tests. Certain medical conditions and the use of certain substances can cause symptoms similar to those associated with GAD. Your caregiver may refer you to a mental health specialist for further evaluation. TREATMENT The following therapies are usually used to treat GAD:   Medication Antidepressant medication usually is prescribed for long-term daily control. Antianxiety medications may be added in severe cases, especially when panic attacks occur.   Talk therapy (psychotherapy) Certain types of talk therapy can be helpful in treating GAD by providing support, education, and guidance. A form of talk therapy called cognitive behavioral therapy can teach you healthy ways to think about and react to daily life  events and activities.  Stress managementtechniques These include yoga, meditation, and exercise and can be very helpful when they are practiced regularly. A mental health specialist can help determine which treatment is best for you. Some people see improvement with one therapy. However, other people require a combination of therapies. Document Released: 04/02/2013 Document Reviewed: 04/02/2013 [facility] Patient Information 2014 [place], [place].

## 2015-06-30 ENCOUNTER — Emergency Department (HOSPITAL_BASED_OUTPATIENT_CLINIC_OR_DEPARTMENT_OTHER)
Admission: EM | Admit: 2015-06-30 | Discharge: 2015-06-30 | Disposition: A | Discharge status: Home / Self Care | Payer: Managed Care, Other (non HMO) | Attending: Emergency Medicine | Admitting: Emergency Medicine

## 2015-06-30 ENCOUNTER — Encounter (HOSPITAL_BASED_OUTPATIENT_CLINIC_OR_DEPARTMENT_OTHER): Payer: Self-pay | Admitting: *Deleted

## 2015-06-30 DIAGNOSIS — H9209 Otalgia, unspecified ear: Secondary | ICD-10-CM | POA: Insufficient documentation

## 2015-06-30 DIAGNOSIS — F410 Panic disorder [episodic paroxysmal anxiety] without agoraphobia: Secondary | ICD-10-CM | POA: Insufficient documentation

## 2015-06-30 DIAGNOSIS — Z720 Tobacco use: Secondary | ICD-10-CM | POA: Diagnosis not present

## 2015-06-30 DIAGNOSIS — Z79899 Other long term (current) drug therapy: Secondary | ICD-10-CM | POA: Diagnosis not present

## 2015-06-30 DIAGNOSIS — M436 Torticollis: Secondary | ICD-10-CM | POA: Insufficient documentation

## 2015-06-30 DIAGNOSIS — G43809 Other migraine, not intractable, without status migrainosus: Secondary | ICD-10-CM | POA: Insufficient documentation

## 2015-06-30 DIAGNOSIS — F909 Attention-deficit hyperactivity disorder, unspecified type: Secondary | ICD-10-CM | POA: Insufficient documentation

## 2015-06-30 DIAGNOSIS — J45909 Unspecified asthma, uncomplicated: Secondary | ICD-10-CM | POA: Insufficient documentation

## 2015-06-30 DIAGNOSIS — F329 Major depressive disorder, single episode, unspecified: Secondary | ICD-10-CM | POA: Insufficient documentation

## 2015-06-30 DIAGNOSIS — G43909 Migraine, unspecified, not intractable, without status migrainosus: Secondary | ICD-10-CM | POA: Diagnosis present

## 2015-06-30 HISTORY — DX: Migraine, unspecified, not intractable, without status migrainosus: G43.909

## 2015-06-30 HISTORY — DX: Attention-deficit hyperactivity disorder, unspecified type: F90.9

## 2015-06-30 MED ORDER — SODIUM CHLORIDE 0.9 % IV BOLUS (SEPSIS)
1000.0000 mL | Freq: Once | INTRAVENOUS | Status: AC
  Administered 2015-06-30: 1000 mL via INTRAVENOUS

## 2015-06-30 MED ORDER — DIPHENHYDRAMINE HCL 50 MG/ML IJ SOLN
25.0000 mg | Freq: Once | INTRAMUSCULAR | Status: AC
  Administered 2015-06-30: 25 mg via INTRAVENOUS
  Filled 2015-06-30: qty 1

## 2015-06-30 MED ORDER — DEXAMETHASONE SODIUM PHOSPHATE 10 MG/ML IJ SOLN
10.0000 mg | Freq: Once | INTRAMUSCULAR | Status: AC
  Administered 2015-06-30: 10 mg via INTRAVENOUS
  Filled 2015-06-30: qty 1

## 2015-06-30 MED ORDER — KETOROLAC TROMETHAMINE 30 MG/ML IJ SOLN
30.0000 mg | Freq: Once | INTRAMUSCULAR | Status: AC
  Administered 2015-06-30: 30 mg via INTRAVENOUS
  Filled 2015-06-30: qty 1

## 2015-06-30 MED ORDER — METOCLOPRAMIDE HCL 5 MG/ML IJ SOLN
10.0000 mg | Freq: Once | INTRAMUSCULAR | Status: AC
  Administered 2015-06-30: 10 mg via INTRAVENOUS
  Filled 2015-06-30: qty 2

## 2015-06-30 NOTE — ED Provider Notes (Signed)
CSN: [id number]     Arrival date & time 06/30/15  1825 History  This chart was scribed for Geoffery Lyonsouglas Yunior Jain, MD by Budd PalmerVanessa Prueter, ED Scribe. This patient was seen in room MH11/MH11 and the patient's care was started at 7:56 PM.    Chief Complaint  Patient presents with  . Migraine   Patient is a 24 y.o. female presenting with migraines. The history is provided by the patient. No language interpreter was used.  Migraine This is a recurrent problem. The current episode started more than 2 days ago. The problem occurs constantly. The problem has been gradually worsening. Associated symptoms include headaches. Nothing relieves the symptoms.   HPI Comments: Megan Sampson is a 24 y.o. female who presents to the Emergency Department complaining of a constant, worsening, migraine onset 3 days ago. She states that initially it was al over, but now it is just on the right side. She reports associated ear pain, visual disturbances (spots), neck pain. She has a PMHx of migraines.  She has been taking ibuprofen with no relief. She is taking Adderall for ADHD, but has not taken any today, as it seems to worsen her migraine. She denies associated fever.   Past Medical History  Diagnosis Date  . Depression     hx of d/o w/ depression (hospital admission - 2009  . Asthma     exercise- induced  . Anxiety   . Panic attack   . Migraine   . ADHD (attention deficit hyperactivity disorder)    Past Surgical History  Procedure Laterality Date  . Left knee arthroscopic  2008  . Lumbar stenosis  [id number] (606 months of age)  . Knee arthroscopy Right    Family History  Problem Relation Age of Onset  . Asthma Mother   . Allergies Mother   . Hyperlipidemia Maternal Grandfather   . Hypertension Maternal Grandfather   . Alcohol abuse Other    History  Substance Use Topics  . Smoking status: Current Every Day Smoker -- 0.50 packs/day for 1 years    Types: Cigarettes  . Smokeless tobacco: Never Used  . Alcohol  Use: Yes     Comment: occassional    OB History    No data available     Review of Systems  Constitutional: Negative for fever.  HENT: Positive for ear pain.   Eyes: Positive for visual disturbance.  Musculoskeletal: Positive for neck pain and neck stiffness.  Neurological: Positive for headaches.  All other systems reviewed and are negative.   Allergies  Metronidazole and Ropinirole hydrochloride  Home Medications   Prior to Admission medications   Medication Sig Start Date End Date Taking? Authorizing Provider  albuterol (PROVENTIL HFA;VENTOLIN HFA) 108 (90 BASE) MCG/ACT inhaler Inhale 1 puff into the lungs every 6 (six) hours as needed for wheezing or shortness of breath.   Yes Historical Provider, MD  ALPRAZolam Prudy Feeler(XANAX) 0.5 MG tablet Take 0.5 mg by mouth 2 (two) times daily as needed for anxiety.   Yes Historical Provider, MD  amphetamine-dextroamphetamine (ADDERALL) 10 MG tablet Take 10 mg by mouth 3 (three) times daily.   Yes Historical Provider, MD  cyclobenzaprine (FLEXERIL) 10 MG tablet Take 10 mg by mouth 3 (three) times daily as needed for muscle spasms.    Historical Provider, MD  zolpidem (AMBIEN) 5 MG tablet Take 1 tablet (5 mg total) by mouth at bedtime as needed for sleep. 03/04/14   Derwood KaplanAnkit Nanavati, MD  zolpidem (AMBIEN) 5 MG tablet Take 1  tablet (5 mg total) by mouth at bedtime as needed for sleep. 03/04/14   Derwood Kaplan, MD  zolpidem (AMBIEN) 5 MG tablet Take 1 tablet (5 mg total) by mouth at bedtime as needed for sleep. 03/04/14   Ankit Rhunette Croft, MD   BP 119/64 mmHg  Pulse 80  Temp(Src) 97.9 F (36.6 C) (Oral)  Resp 14  Ht  (1.473 m)  Wt 94 lb (42.638 kg)  BMI 19.65 kg/m2  SpO2 97%  LMP 06/16/2015 Physical Exam  Constitutional: She is oriented to person, place, and time. She appears well-developed and well-nourished. No distress.  HENT:  Head: Normocephalic and atraumatic.  Mouth/Throat: Oropharynx is clear and moist.  Eyes: Conjunctivae and EOM  are normal. Pupils are equal, round, and reactive to light.  No papilledema on funduscopic exam.  Neck: Normal range of motion. Neck supple. No tracheal deviation present.  Cardiovascular: Normal rate.   Pulmonary/Chest: Breath sounds normal. No respiratory distress.  Abdominal: Soft.  Musculoskeletal: Normal range of motion.  Neurological: She is alert and oriented to person, place, and time. No cranial nerve deficit. She exhibits normal muscle tone. Coordination normal.  Skin: Skin is warm and dry.  Psychiatric: She has a normal mood and affect. Her behavior is normal.  Nursing note and vitals reviewed.   ED Course  Procedures  DIAGNOSTIC STUDIES: Oxygen Saturation is 97% on RA, normal by my interpretation.    COORDINATION OF CARE: 8:00 PM - Discussed plans to order migraine cocktail. Pt advised of plan for treatment and pt agrees.  Labs Review Labs Reviewed - No data to display  Imaging Review No results found.   EKG Interpretation None      MDM   Final diagnoses:  None    Patient presents with complaints of migraine headache. Her neurologic exam is nonfocal and symptoms are consistent with prior migraines. She is feeling better with IV fluids and a migraine cocktail. She will be discharged to home with when necessary follow-up/return.  I personally performed the services described in this documentation, which was scribed in my presence. The recorded information has been reviewed and is accurate.      Geoffery Lyons, MD 06/30/15 [phone]

## 2015-06-30 NOTE — ED Notes (Signed)
Pt c/o migraine x3 days. She has a hx of migraines and sts this one started mild but has become severe. Pt also sts pain was initially bilat but is now right side only.

## 2015-06-30 NOTE — Discharge Instructions (Signed)

## 2018-10-11 ENCOUNTER — Emergency Department (HOSPITAL_BASED_OUTPATIENT_CLINIC_OR_DEPARTMENT_OTHER)
Admission: EM | Admit: 2018-10-11 | Discharge: 2018-10-11 | Disposition: A | Discharge status: Home / Self Care | Payer: BLUE CROSS/BLUE SHIELD | Attending: Emergency Medicine | Admitting: Emergency Medicine

## 2018-10-11 ENCOUNTER — Encounter (HOSPITAL_BASED_OUTPATIENT_CLINIC_OR_DEPARTMENT_OTHER): Payer: Self-pay | Admitting: Emergency Medicine

## 2018-10-11 ENCOUNTER — Other Ambulatory Visit: Payer: Self-pay

## 2018-10-11 ENCOUNTER — Emergency Department (HOSPITAL_BASED_OUTPATIENT_CLINIC_OR_DEPARTMENT_OTHER): Payer: BLUE CROSS/BLUE SHIELD

## 2018-10-11 DIAGNOSIS — F909 Attention-deficit hyperactivity disorder, unspecified type: Secondary | ICD-10-CM | POA: Insufficient documentation

## 2018-10-11 DIAGNOSIS — Y9389 Activity, other specified: Secondary | ICD-10-CM | POA: Diagnosis not present

## 2018-10-11 DIAGNOSIS — Y92410 Unspecified street and highway as the place of occurrence of the external cause: Secondary | ICD-10-CM | POA: Diagnosis not present

## 2018-10-11 DIAGNOSIS — S46912A Strain of unspecified muscle, fascia and tendon at shoulder and upper arm level, left arm, initial encounter: Secondary | ICD-10-CM | POA: Insufficient documentation

## 2018-10-11 DIAGNOSIS — S4992XA Unspecified injury of left shoulder and upper arm, initial encounter: Secondary | ICD-10-CM | POA: Diagnosis present

## 2018-10-11 DIAGNOSIS — Y999 Unspecified external cause status: Secondary | ICD-10-CM | POA: Diagnosis not present

## 2018-10-11 DIAGNOSIS — Z79899 Other long term (current) drug therapy: Secondary | ICD-10-CM | POA: Insufficient documentation

## 2018-10-11 DIAGNOSIS — F17210 Nicotine dependence, cigarettes, uncomplicated: Secondary | ICD-10-CM | POA: Insufficient documentation

## 2018-10-11 MED ORDER — ACETAMINOPHEN 325 MG PO TABS
650.0000 mg | ORAL_TABLET | Freq: Once | ORAL | Status: AC
  Administered 2018-10-11: 650 mg via ORAL
  Filled 2018-10-11: qty 2

## 2018-10-11 NOTE — Discharge Instructions (Signed)
Use ice, ibuprofen, Tylenol as needed for pain.  Return for worsening or new concerns.

## 2018-10-11 NOTE — ED Notes (Signed)
Pt/family verbalized understanding of discharge instructions.   
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
